# Patient Record
Sex: Male | Born: 1960 | Race: White | Hispanic: No | Marital: Single | Smoking: Never smoker
Health system: Southern US, Community
[De-identification: ages and names within clinical notes are randomized; demographics above are authoritative.]

## PROBLEM LIST (undated history)

## (undated) DIAGNOSIS — R310 Gross hematuria: Secondary | ICD-10-CM

## (undated) HISTORY — DX: Gross hematuria: R31.0

## (undated) HISTORY — PX: HIP FRACTURE SURGERY: SHX118

## (undated) HISTORY — PX: TONSILLECTOMY: SUR1361

---

## 2006-08-05 HISTORY — PX: NASAL SINUS SURGERY: SHX719

## 2009-05-25 ENCOUNTER — Ambulatory Visit: Payer: Self-pay | Admitting: Otolaryngology

## 2009-09-11 ENCOUNTER — Ambulatory Visit: Payer: Self-pay | Admitting: Neurology

## 2011-11-08 ENCOUNTER — Ambulatory Visit: Payer: Self-pay

## 2011-11-29 ENCOUNTER — Ambulatory Visit: Payer: Self-pay | Admitting: Internal Medicine

## 2011-11-29 LAB — COMPREHENSIVE METABOLIC PANEL
Albumin: 4.1 g/dL (ref 3.4–5.0)
Anion Gap: 5 — ABNORMAL LOW (ref 7–16)
BUN: 11 mg/dL (ref 7–18)
Calcium, Total: 9.3 mg/dL (ref 8.5–10.1)
Co2: 30 mmol/L (ref 21–32)
Creatinine: 1 mg/dL (ref 0.60–1.30)
EGFR (African American): >60
Glucose: 114 mg/dL — ABNORMAL HIGH (ref 65–99)
Osmolality: 278 (ref 275–301)
SGPT (ALT): 25 U/L
Sodium: 139 mmol/L (ref 136–145)
Total Protein: 6.8 g/dL (ref 6.4–8.2)

## 2011-11-29 LAB — CBC WITH DIFFERENTIAL/PLATELET
Basophil #: 0 10*3/uL (ref 0.0–0.1)
Basophil %: 0.3 %
Eosinophil #: 0.1 10*3/uL (ref 0.0–0.7)
Lymphocyte #: 0.8 10*3/uL — ABNORMAL LOW (ref 1.0–3.6)
Lymphocyte %: 5.9 %
MCH: 30.9 pg (ref 26.0–34.0)
Monocyte %: 4.8 %
Neutrophil %: 88.6 %
Platelet: 236 10*3/uL (ref 150–440)
WBC: 12.8 10*3/uL — ABNORMAL HIGH (ref 3.8–10.6)

## 2012-05-30 ENCOUNTER — Emergency Department: Payer: Self-pay | Admitting: Emergency Medicine

## 2012-05-30 LAB — COMPREHENSIVE METABOLIC PANEL
Anion Gap: 12 (ref 7–16)
Bilirubin,Total: 0.6 mg/dL (ref 0.2–1.0)
Chloride: 109 mmol/L — ABNORMAL HIGH (ref 98–107)
Co2: 23 mmol/L (ref 21–32)
Creatinine: 1.14 mg/dL (ref 0.60–1.30)
EGFR (African American): >60
EGFR (Non-African Amer.): >60
Osmolality: 286 (ref 275–301)
Potassium: 2.8 mmol/L — ABNORMAL LOW (ref 3.5–5.1)
Sodium: 144 mmol/L (ref 136–145)

## 2012-05-30 LAB — CBC
HCT: 43.1 % (ref 40.0–52.0)
HGB: 14.8 g/dL (ref 13.0–18.0)
MCHC: 34.3 g/dL (ref 32.0–36.0)
Platelet: 285 10*3/uL (ref 150–440)
WBC: 10.2 10*3/uL (ref 3.8–10.6)

## 2012-05-30 LAB — TROPONIN I: Troponin-I: <0.02 ng/mL

## 2013-05-25 ENCOUNTER — Ambulatory Visit: Payer: Self-pay | Admitting: Family Medicine

## 2013-05-25 LAB — COMPREHENSIVE METABOLIC PANEL
Albumin: 4.3 g/dL (ref 3.4–5.0)
Alkaline Phosphatase: 102 U/L (ref 50–136)
BUN: 9 mg/dL (ref 7–18)
Bilirubin,Total: 0.9 mg/dL (ref 0.2–1.0)
Calcium, Total: 9 mg/dL (ref 8.5–10.1)
Chloride: 97 mmol/L — ABNORMAL LOW (ref 98–107)
EGFR (African American): >60
Glucose: 106 mg/dL — ABNORMAL HIGH (ref 65–99)
Osmolality: 267 (ref 275–301)
SGOT(AST): 23 U/L (ref 15–37)
Total Protein: 7.8 g/dL (ref 6.4–8.2)

## 2013-05-25 LAB — CBC WITH DIFFERENTIAL/PLATELET
Basophil #: 0 10*3/uL (ref 0.0–0.1)
Basophil %: 0.4 %
Eosinophil #: 0 10*3/uL (ref 0.0–0.7)
Eosinophil %: 0.1 %
HCT: 48.3 % (ref 40.0–52.0)
HGB: 16 g/dL (ref 13.0–18.0)
Lymphocyte #: 0.5 10*3/uL — ABNORMAL LOW (ref 1.0–3.6)
MCHC: 33.1 g/dL (ref 32.0–36.0)
Monocyte #: 0.4 x10 3/mm (ref 0.2–1.0)
Monocyte %: 8.1 %
Neutrophil #: 4.4 10*3/uL (ref 1.4–6.5)
Neutrophil %: 81.9 %
Platelet: 225 10*3/uL (ref 150–440)

## 2013-05-25 LAB — URINALYSIS, COMPLETE
Bacteria: NEGATIVE
Ph: 6.5 (ref 4.5–8.0)
Protein: 30
RBC,UR: >30 /HPF (ref 0–5)
Specific Gravity: 1.02 (ref 1.003–1.030)
Squamous Epithelial: NONE SEEN

## 2014-04-15 DIAGNOSIS — Z0189 Encounter for other specified special examinations: Secondary | ICD-10-CM | POA: Insufficient documentation

## 2016-02-09 ENCOUNTER — Other Ambulatory Visit: Payer: Self-pay | Admitting: Family Medicine

## 2016-02-09 DIAGNOSIS — R109 Unspecified abdominal pain: Secondary | ICD-10-CM

## 2016-02-14 ENCOUNTER — Ambulatory Visit
Admission: RE | Admit: 2016-02-14 | Discharge: 2016-02-14 | Disposition: A | Discharge status: Home / Self Care | Payer: 59 | Source: Ambulatory Visit | Attending: Family Medicine | Admitting: Family Medicine

## 2016-02-14 DIAGNOSIS — R109 Unspecified abdominal pain: Secondary | ICD-10-CM | POA: Insufficient documentation

## 2016-02-14 DIAGNOSIS — N400 Enlarged prostate without lower urinary tract symptoms: Secondary | ICD-10-CM | POA: Insufficient documentation

## 2016-07-09 ENCOUNTER — Ambulatory Visit
Admission: RE | Admit: 2016-07-09 | Discharge: 2016-07-09 | Disposition: A | Discharge status: Home / Self Care | Payer: 59 | Source: Ambulatory Visit | Attending: Family Medicine | Admitting: Family Medicine

## 2016-07-09 ENCOUNTER — Other Ambulatory Visit: Payer: Self-pay | Admitting: Family Medicine

## 2016-07-09 DIAGNOSIS — R319 Hematuria, unspecified: Secondary | ICD-10-CM | POA: Insufficient documentation

## 2016-07-09 DIAGNOSIS — R109 Unspecified abdominal pain: Secondary | ICD-10-CM | POA: Diagnosis present

## 2016-07-09 DIAGNOSIS — R310 Gross hematuria: Secondary | ICD-10-CM

## 2016-07-10 ENCOUNTER — Other Ambulatory Visit: Payer: Self-pay | Admitting: Family Medicine

## 2016-07-12 ENCOUNTER — Ambulatory Visit: Admission: RE | Admit: 2016-07-12 | Payer: 59 | Source: Ambulatory Visit

## 2016-07-18 ENCOUNTER — Other Ambulatory Visit: Payer: Self-pay | Admitting: *Deleted

## 2016-07-18 DIAGNOSIS — R310 Gross hematuria: Secondary | ICD-10-CM

## 2016-07-19 ENCOUNTER — Other Ambulatory Visit
Admission: RE | Admit: 2016-07-19 | Discharge: 2016-07-19 | Disposition: A | Discharge status: Home / Self Care | Payer: 59 | Source: Ambulatory Visit | Attending: Urology | Admitting: Urology

## 2016-07-19 ENCOUNTER — Ambulatory Visit (INDEPENDENT_AMBULATORY_CARE_PROVIDER_SITE_OTHER): Payer: 59 | Admitting: Urology

## 2016-07-19 ENCOUNTER — Encounter: Payer: Self-pay | Admitting: Urology

## 2016-07-19 VITALS — BP 132/73 | HR 76 | Ht 71.0 in | Wt 147.0 lb

## 2016-07-19 DIAGNOSIS — R310 Gross hematuria: Secondary | ICD-10-CM | POA: Insufficient documentation

## 2016-07-19 LAB — CREATININE, SERUM
Creatinine, Ser: 0.9 mg/dL (ref 0.61–1.24)
GFR calc Af Amer: >60 mL/min (ref >60)
GFR calc non Af Amer: >60 mL/min (ref >60)

## 2016-07-19 LAB — URINALYSIS, COMPLETE (UACMP) WITH MICROSCOPIC
BILIRUBIN URINE: NEGATIVE
Bacteria, UA: NONE SEEN
Glucose, UA: NEGATIVE mg/dL
KETONES UR: NEGATIVE mg/dL
LEUKOCYTES UA: NEGATIVE
NITRITE: NEGATIVE
Protein, ur: NEGATIVE mg/dL
SPECIFIC GRAVITY, URINE: 1.02 (ref 1.005–1.030)
SQUAMOUS EPITHELIAL / LPF: NONE SEEN
WBC, UA: NONE SEEN WBC/hpf (ref 0–5)
pH: 5.5 (ref 5.0–8.0)

## 2016-07-19 LAB — BUN: BUN: 10 mg/dL (ref 6–20)

## 2016-07-19 NOTE — Progress Notes (Signed)
07/19/2016 12:09 PM   Zachary RuizJohn Mikel CellaM Doyle 08-08-1960 [id number]  [facility] [place] [address] [place] [place], [place] [id number]  Chief Complaint  Patient presents with  . New Patient (Initial Visit)    gross hematuria referred by Rolin BarryMario Doyle    HPI: Patient is a 55 -year-old Caucasian male who presents today as a referral from their PCP, Doyle, for gross hematuria.    Patient stated that about 1 week and half ago he had an episode of 2 days of gross hematuria with the morning void that was not painful.  Urine culture performed on 07/08/2016 noted no growth.    Patient doesn't have a prior history of microscopic hematuria.    He does not have a prior history of recurrent urinary tract infections, nephrolithiasis, trauma to the genitourinary tract, BPH or malignancies of the genitourinary tract.  He does not have a family medical history of nephrolithiasis, malignancies of the genitourinary tract or hematuria.   Today, he is having symptoms of urgency, nocturia, incontinence and a weak urinary stream.  His UA today demonstrates 0-5 RBC's.  He is not experiencing any suprapubic pain, abdominal pain or flank pain. He denies any recent fevers, chills, nausea or vomiting.   Renal ultrasound performed on 02/14/2016 for right flank pain noted no evidence of urinary tract stones or hydronephrosis. Normal renal parenchyma. Mild prostate enlargement. Normal appearance of the urinary bladder.  Dependent reviewed the films.  CT renal stone study performed on 07/09/2016 noted anal ureteral stones. No hydronephrosis. No acute findings in the abdomen or pelvis. I have independently reviewed these films.  He is not a smoker.   He is exposed to secondhand smoke.  He has not worked with [employer].    PMH: Past Medical History:  Diagnosis Date  . Gross hematuria     Surgical History: Past Surgical History:  Procedure Laterality Date  . HIP FRACTURE SURGERY Right    . NASAL SINUS SURGERY  2008  . TONSILLECTOMY      Home Medications:  Allergies as of 07/19/2016   No Known Allergies     Medication List       Accurate as of 07/19/16 12:09 PM. Always use your most recent med list.          fluticasone 50 MCG/ACT nasal spray Commonly known as:  FLONASE Place into the nose.   guaiFENesin-codeine 100-10 MG/5ML syrup Commonly known as:  ROBITUSSIN AC Take by mouth.   ibuprofen 200 MG tablet Commonly known as:  ADVIL,MOTRIN Take by mouth.       Allergies: No Known Allergies  Family History: Family History  Problem Relation Age of Onset  . Cancer Sister     appendix  . Prostate cancer Neg Hx   . Kidney cancer Neg Hx   . Bladder Cancer Neg Hx     Social History:  reports that he has never smoked. He has never used smokeless tobacco. He reports that he drinks alcohol. He reports that he does not use drugs.  ROS: UROLOGY Frequent Urination?: No Hard to postpone urination?: Yes Burning/pain with urination?: No Get up at night to urinate?: Yes Leakage of urine?: No Urine stream starts and stops?: No Trouble starting stream?: No Do you have to strain to urinate?: No Blood in urine?: Yes Urinary tract infection?: No Sexually transmitted disease?: No Injury to kidneys or bladder?: No Painful intercourse?: No Weak stream?: Yes Erection problems?: No Penile pain?: No  Gastrointestinal Nausea?:  No Vomiting?: No Indigestion/heartburn?: No Diarrhea?: No Constipation?: No  Constitutional Fever: No Night sweats?: No Weight loss?: No Fatigue?: No  Skin Skin rash/lesions?: No Itching?: No  Eyes Blurred vision?: No Double vision?: No  Ears/Nose/Throat Sore throat?: No Sinus problems?: Yes  Hematologic/Lymphatic Swollen glands?: No Easy bruising?: No  Cardiovascular Leg swelling?: No Chest pain?: No  Respiratory Cough?: No Shortness of breath?: No  Endocrine Excessive thirst?:  No  Musculoskeletal Back pain?: No Joint pain?: No  Neurological Headaches?: No Dizziness?: No  Psychologic Depression?: No Anxiety?: No  Physical Exam: BP 132/73   Pulse 76   Ht 5\' 11"  (1.803 Zachary)   Wt 147 lb (66.7 kg)   BMI 20.50 kg/Zachary   Constitutional: Well nourished. Alert and oriented, No acute distress. HEENT: [place] AT, moist mucus membranes. Trachea midline, no masses. Cardiovascular: No clubbing, cyanosis, or edema. Respiratory: Normal respiratory effort, no increased work of breathing. GI: Abdomen is soft, non tender, non distended, no abdominal masses. Liver and spleen not palpable.  No hernias appreciated.  Stool sample for occult testing is not indicated.   GU: No CVA tenderness.  No bladder fullness or masses.  Patient with circumcised phallus.  Urethral meatus is patent.  No penile discharge. No penile lesions or rashes. Scrotum without lesions, cysts, rashes and/or edema.  Testicles are located scrotally bilaterally. No masses are appreciated in the testicles. Left and right epididymis are normal. Rectal: Patient with  normal sphincter tone. Anus and perineum without scarring or rashes. No rectal masses are appreciated. Prostate is approximately 50 grams, no nodules are appreciated. Seminal vesicles are normal. Skin: No rashes, bruises or suspicious lesions. Lymph: No cervical or inguinal adenopathy. Neurologic: Grossly intact, no focal deficits, moving all 4 extremities. Psychiatric: Normal mood and affect.  Laboratory Data: PSA History  0.9 ng/mL on 11/15/2013  1.01 ng/mL on 01/31/2016 Lab Results  Component Value Date   WBC 5.4 05/25/2013   HGB 16.0 05/25/2013   HCT 48.3 05/25/2013   MCV 92 05/25/2013   PLT 225 05/25/2013    Lab Results  Component Value Date   CREATININE 0.90 07/19/2016    Lab Results  Component Value Date   AST 23 05/25/2013   Lab Results  Component Value Date   ALT 34 05/25/2013     Urinalysis 0-5 RBC's.  See EPIC.     Pertinent Imaging: CLINICAL DATA:  Six-month history of right flank pain  EXAM: RENAL / URINARY TRACT ULTRASOUND COMPLETE  COMPARISON:  Noncontrast abdominal CT scan of May 28, 2013  FINDINGS: Right Kidney:  Length: 10.4 cm. Echogenicity within normal limits. No mass, stones, or hydronephrosis visualized.  Left Kidney:  Length: 11.2 cm. Echogenicity within normal limits. No mass, stones, or hydronephrosis visualized.  Bladder:  The partially distended urinary bladder is normal. Bilateral ureteral jets are observed. The prostate gland is mildly prominent measuring 5.0 x 3.0 x 2.7 cm.  IMPRESSION: 1. No evidence of urinary tract stones or hydronephrosis. Normal renal parenchyma. 2. Mild prostate enlargement. Normal appearance of the urinary bladder.   Electronically Signed   By: David  [place]il[place] Zachary.D.   On: 02/14/2016 10:31  CLINICAL DATA:  Right flank pain for 3 days, hematuria  EXAM: CT ABDOMEN AND PELVIS WITHOUT CONTRAST  TECHNIQUE: Multidetector CT imaging of the abdomen and pelvis was performed following the standard protocol without IV contrast.  COMPARISON:  05/25/2013  FINDINGS: Lower chest: Lung bases are clear. No effusions. Heart is normal size.  Hepatobiliary: No focal hepatic abnormality.  Gallbladder unremarkable.  Pancreas: No focal abnormality or ductal dilatation.  Spleen: No focal abnormality.  Normal size.  Adrenals/Urinary Tract: No adrenal abnormality. No focal renal abnormality. No stones or hydronephrosis. Urinary bladder is unremarkable.  Stomach/Bowel: Stomach, large and small bowel grossly unremarkable.  Vascular/Lymphatic: No evidence of aneurysm or adenopathy. Scattered aortic calcifications.  Reproductive: Mild prostate enlargement with central calcifications.  Other: No free fluid or free air.  Musculoskeletal: No acute bony abnormality or focal bone lesion.  IMPRESSION: No renal or  ureteral stones.  No hydronephrosis.  No acute findings in the abdomen or pelvis.   Electronically Signed   By: Charlett Nose Zachary.D.   On: 07/09/2016 11:07  Assessment & Plan:    1. Gross hematuria   - I explained to the patient that there are a number of causes that can be associated with blood in the urine, such as stones, UTI's, damage to the urinary tract and/or cancer.  - At this time, I felt that the patient warranted further urologic evaluation.   The AUA guidelines state that a CT urogram is the preferred imaging study to evaluate hematuria- I explained that the noncontrast study he had earlier this month lacked detail to rule out urological tumors  - I explained to the patient that a contrast material will be injected into a vein and that in rare instances, an allergic reaction can result and may even life threatening   The patient denies any allergies to contrast, iodine and/or seafood and is not taking metformin.  - Following the imaging study,  I've recommended a cystoscopy. I described how this is performed, typically in an office setting with a flexible cystoscope. We described the risks, benefits, and possible side effects, the most common of which is a minor amount of blood in the urine and/or burning which usually resolves in 24 to 48 hours.    - The patient had the opportunity to ask questions which were answered. Based upon this discussion, the patient is willing to proceed. Therefore, I've ordered: a CT Urogram and cystoscopy.  - The patient will return following all of the above for discussion of the results.     - BUN + creatinine  - urinalysis  - urine culture  Return for CT Urogram report and cystoscopy.  These notes generated with voice recognition software. I apologize for typographical errors.  Michiel Cowboy, PA-C  [facility] [address], [address] [place], [place] [address] [phone]

## 2016-07-20 LAB — URINE CULTURE: Culture: NO GROWTH

## 2016-08-20 ENCOUNTER — Ambulatory Visit
Admission: RE | Admit: 2016-08-20 | Discharge: 2016-08-20 | Disposition: A | Discharge status: Home / Self Care | Payer: 59 | Source: Ambulatory Visit | Attending: Urology | Admitting: Urology

## 2016-08-20 DIAGNOSIS — R310 Gross hematuria: Secondary | ICD-10-CM | POA: Insufficient documentation

## 2016-08-20 DIAGNOSIS — I700 Atherosclerosis of aorta: Secondary | ICD-10-CM | POA: Insufficient documentation

## 2016-08-20 MED ORDER — IOPAMIDOL (ISOVUE-300) INJECTION 61%
150.0000 mL | Freq: Once | INTRAVENOUS | Status: AC | PRN
  Administered 2016-08-20: 125 mL via INTRAVENOUS

## 2016-08-23 ENCOUNTER — Ambulatory Visit (INDEPENDENT_AMBULATORY_CARE_PROVIDER_SITE_OTHER): Payer: 59 | Admitting: Urology

## 2016-08-23 ENCOUNTER — Other Ambulatory Visit
Admission: RE | Admit: 2016-08-23 | Discharge: 2016-08-23 | Disposition: A | Discharge status: Home / Self Care | Payer: 59 | Source: Ambulatory Visit | Attending: Urology | Admitting: Urology

## 2016-08-23 ENCOUNTER — Encounter: Payer: Self-pay | Admitting: Urology

## 2016-08-23 ENCOUNTER — Other Ambulatory Visit: Payer: Self-pay

## 2016-08-23 VITALS — BP 142/74 | HR 100 | Ht 71.0 in | Wt 150.0 lb

## 2016-08-23 DIAGNOSIS — R310 Gross hematuria: Secondary | ICD-10-CM | POA: Insufficient documentation

## 2016-08-23 DIAGNOSIS — N400 Enlarged prostate without lower urinary tract symptoms: Secondary | ICD-10-CM

## 2016-08-23 LAB — URINALYSIS, COMPLETE (UACMP) WITH MICROSCOPIC
BILIRUBIN URINE: NEGATIVE
Bacteria, UA: NONE SEEN
GLUCOSE, UA: NEGATIVE mg/dL
KETONES UR: NEGATIVE mg/dL
Leukocytes, UA: NEGATIVE
NITRITE: NEGATIVE
PH: 6 (ref 5.0–8.0)
PROTEIN: NEGATIVE mg/dL
SQUAMOUS EPITHELIAL / LPF: NONE SEEN
Specific Gravity, Urine: 1.025 (ref 1.005–1.030)

## 2016-08-23 MED ORDER — LIDOCAINE HCL 2 % EX GEL
1.0000 "application " | Freq: Once | CUTANEOUS | Status: AC
  Administered 2016-08-23: 1 via URETHRAL

## 2016-08-23 MED ORDER — CIPROFLOXACIN HCL 500 MG PO TABS
500.0000 mg | ORAL_TABLET | Freq: Once | ORAL | Status: AC
  Administered 2016-08-23: 500 mg via ORAL

## 2016-08-23 NOTE — Progress Notes (Signed)
   08/24/16  CC:  Chief Complaint  Patient presents with  . Cysto    HPI: 56 year old male with gross hematuria who presents today for office cystoscopy to complete his workup.  CT urogram on 08/20/2016 shows no evidence of GU pathology.   He does not have a prior history of recurrent urinary tract infections, nephrolithiasis, trauma to the genitourinary tract, BPH or malignancies of the genitourinary tract.  He does not have a family medical history of nephrolithiasis, malignancies of the genitourinary tract or hematuria.   He is a nonsmoker. He does have exposure to secondhand smoking. No chemical exposures.  Exam Vitals:   08/23/16 1450  Weight: 150 lb (68 kg)  Height: 5\' 11"  (1.803 m)   NED. A&Ox3.   No respiratory distress   Abd soft, NT, ND Normal phallus with bilateral descended testicles  Cystoscopy Procedure Note  Patient identification was confirmed, informed consent was obtained, and patient was prepped using Betadine solution.  Lidocaine jelly was administered per urethral meatus.    Preoperative abx where received prior to procedure.     Pre-Procedure: - Inspection reveals a normal caliber ureteral meatus.  Procedure: The flexible cystoscope was introduced without difficulty - No urethral strictures/lesions are present. - Mildly enlarged prostate, friable mucosa - Normal bladder neck - Bilateral ureteral orifices identified - Bladder mucosa  reveals no ulcers, tumors, or lesions - No bladder stones - No trabeculation  Retroflexion shows subtle intravesical protrusion with few ectatic vessels.   Post-Procedure: - Patient tolerated the procedure well  Assessment/ Plan:  1. Gross hematuria Cystoscopy to friable prostatic mucosa- likely cause of hematuria CT Urogram unremarkable Discussed finasteride for bleeding  Episodes are rare and mild,  otherwise no obstructing voiding symptoms As such, will defer at this time, advised to return if  bleeding recurs   - ciprofloxacin (CIPRO) tablet 500 mg; Take 1 tablet (500 mg total) by mouth once. - lidocaine (XYLOCAINE) 2 % jelly 1 application; Place 1 application into the urethra once.  2. Benign prostatic hyperplasia without lower urinary tract symptoms Mildly enlarged prostate on cysto Relatively asymptomatic  Follow up prn  Vanna ScotlandAshley Bette Brienza, MD

## 2018-02-06 ENCOUNTER — Encounter: Payer: Self-pay | Admitting: Emergency Medicine

## 2018-02-06 ENCOUNTER — Observation Stay
Admission: EM | Admit: 2018-02-06 | Discharge: 2018-02-07 | Disposition: A | Discharge status: Home / Self Care | Payer: 59 | Attending: Family Medicine | Admitting: Family Medicine

## 2018-02-06 ENCOUNTER — Other Ambulatory Visit: Payer: Self-pay

## 2018-02-06 DIAGNOSIS — R112 Nausea with vomiting, unspecified: Secondary | ICD-10-CM

## 2018-02-06 DIAGNOSIS — E876 Hypokalemia: Secondary | ICD-10-CM | POA: Diagnosis not present

## 2018-02-06 DIAGNOSIS — F1210 Cannabis abuse, uncomplicated: Secondary | ICD-10-CM | POA: Insufficient documentation

## 2018-02-06 DIAGNOSIS — K529 Noninfective gastroenteritis and colitis, unspecified: Secondary | ICD-10-CM | POA: Diagnosis not present

## 2018-02-06 DIAGNOSIS — N400 Enlarged prostate without lower urinary tract symptoms: Secondary | ICD-10-CM | POA: Insufficient documentation

## 2018-02-06 LAB — CBC
HCT: 45.8 % (ref 40.0–52.0)
Hemoglobin: 15.7 g/dL (ref 13.0–18.0)
MCH: 31.2 pg (ref 26.0–34.0)
MCHC: 34.2 g/dL (ref 32.0–36.0)
MCV: 91.1 fL (ref 80.0–100.0)
PLATELETS: 265 10*3/uL (ref 150–440)
RBC: 5.03 MIL/uL (ref 4.40–5.90)
RDW: 13.6 % (ref 11.5–14.5)
WBC: 10.7 10*3/uL — ABNORMAL HIGH (ref 3.8–10.6)

## 2018-02-06 LAB — COMPREHENSIVE METABOLIC PANEL
ALBUMIN: 4.1 g/dL (ref 3.5–5.0)
ALT: 15 U/L (ref 0–44)
AST: 25 U/L (ref 15–41)
Alkaline Phosphatase: 70 U/L (ref 38–126)
Anion gap: 11 (ref 5–15)
BILIRUBIN TOTAL: 1.5 mg/dL — AB (ref 0.3–1.2)
BUN: 9 mg/dL (ref 6–20)
CHLORIDE: 108 mmol/L (ref 98–111)
CO2: 21 mmol/L — ABNORMAL LOW (ref 22–32)
CREATININE: 0.83 mg/dL (ref 0.61–1.24)
Calcium: 8.4 mg/dL — ABNORMAL LOW (ref 8.9–10.3)
GFR calc Af Amer: >60 mL/min (ref >60)
GLUCOSE: 104 mg/dL — AB (ref 70–99)
Potassium: 3.1 mmol/L — ABNORMAL LOW (ref 3.5–5.1)
Sodium: 140 mmol/L (ref 135–145)
Total Protein: 6.5 g/dL (ref 6.5–8.1)

## 2018-02-06 LAB — URINE DRUG SCREEN, QUALITATIVE (ARMC ONLY)
AMPHETAMINES, UR SCREEN: NOT DETECTED
Benzodiazepine, Ur Scrn: NOT DETECTED
CANNABINOID 50 NG, UR ~~LOC~~: POSITIVE — AB
Cocaine Metabolite,Ur ~~LOC~~: NOT DETECTED
MDMA (ECSTASY) UR SCREEN: NOT DETECTED
Methadone Scn, Ur: NOT DETECTED
OPIATE, UR SCREEN: NOT DETECTED
Phencyclidine (PCP) Ur S: NOT DETECTED
Tricyclic, Ur Screen: NOT DETECTED

## 2018-02-06 LAB — URINALYSIS, COMPLETE (UACMP) WITH MICROSCOPIC
Bacteria, UA: NONE SEEN
Bilirubin Urine: NEGATIVE
GLUCOSE, UA: NEGATIVE mg/dL
Ketones, ur: 80 mg/dL — AB
Leukocytes, UA: NEGATIVE
Nitrite: NEGATIVE
PH: 6 (ref 5.0–8.0)
PROTEIN: NEGATIVE mg/dL
SQUAMOUS EPITHELIAL / LPF: NONE SEEN (ref 0–5)
Specific Gravity, Urine: 1.02 (ref 1.005–1.030)

## 2018-02-06 LAB — LIPASE, BLOOD: LIPASE: 28 U/L (ref 11–51)

## 2018-02-06 MED ORDER — ONDANSETRON HCL 4 MG/2ML IJ SOLN
4.0000 mg | Freq: Once | INTRAMUSCULAR | Status: AC | PRN
  Administered 2018-02-06: 4 mg via INTRAVENOUS
  Filled 2018-02-06: qty 2

## 2018-02-06 MED ORDER — FAMOTIDINE IN NACL 20-0.9 MG/50ML-% IV SOLN
20.0000 mg | Freq: Once | INTRAVENOUS | Status: AC
  Administered 2018-02-06: 20 mg via INTRAVENOUS
  Filled 2018-02-06: qty 50

## 2018-02-06 MED ORDER — METOCLOPRAMIDE HCL 5 MG/ML IJ SOLN
10.0000 mg | Freq: Once | INTRAMUSCULAR | Status: AC
  Administered 2018-02-06: 10 mg via INTRAVENOUS
  Filled 2018-02-06: qty 2

## 2018-02-06 MED ORDER — PROMETHAZINE HCL 25 MG/ML IJ SOLN
25.0000 mg | Freq: Once | INTRAMUSCULAR | Status: AC
  Administered 2018-02-06 – 2018-02-07 (×2): 25 mg via INTRAVENOUS

## 2018-02-06 MED ORDER — SODIUM CHLORIDE 0.9 % IV SOLN
Freq: Once | INTRAVENOUS | Status: AC
  Administered 2018-02-06: 21:00:00 via INTRAVENOUS

## 2018-02-06 MED ORDER — SODIUM CHLORIDE 0.9 % IV BOLUS
1000.0000 mL | Freq: Once | INTRAVENOUS | Status: AC
  Administered 2018-02-06: 1000 mL via INTRAVENOUS

## 2018-02-06 MED ORDER — PROMETHAZINE HCL 25 MG/ML IJ SOLN
INTRAMUSCULAR | Status: AC
  Administered 2018-02-07: 25 mg via INTRAVENOUS
  Filled 2018-02-06: qty 1

## 2018-02-06 NOTE — ED Notes (Signed)
VErbal orders given for fluid bolus at this time

## 2018-02-06 NOTE — ED Notes (Signed)
Urinal provided for pt.

## 2018-02-06 NOTE — ED Provider Notes (Signed)
[facility] Emergency Department Provider Note       Time seen: ----------------------------------------- 8:39 PM on 02/06/2018 -----------------------------------------   I have reviewed the triage vital signs and the nursing notes.  HISTORY   Chief Complaint Emesis and Diarrhea    HPI Zachary Doyle is a 57 y.o. male with no significant past medical history who presents to the ED for intractable nausea and vomiting with some diarrhea over the last 24 hours.  Patient states he started throwing up about this time yesterday, he is complaining of abdominal cramping but no focal pain.  He denies fevers, chills or other complaints.  Prior to my evaluation he received 2 doses of IV Zofran and liter fluids without any improvement in his symptoms.  Past Medical History:  Diagnosis Date  . Gross hematuria     Patient Active Problem List   Diagnosis Date Noted  . Laboratory procedures 04/15/2014    Past Surgical History:  Procedure Laterality Date  . HIP FRACTURE SURGERY Right   . NASAL SINUS SURGERY  2008  . TONSILLECTOMY      Allergies Patient has no known allergies.  Social History Social History   Tobacco Use  . Smoking status: Never Smoker  . Smokeless tobacco: Never Used  Substance Use Topics  . Alcohol use: Yes    Comment: rare  . Drug use: No   Review of Systems Constitutional: Negative for fever. Cardiovascular: Negative for chest pain. Respiratory: Negative for shortness of breath. Gastrointestinal: Positive for abdominal pain, vomiting and diarrhea Musculoskeletal: Negative for back pain. Skin: Negative for rash. Neurological: Negative for headaches, focal weakness or numbness.  All systems negative/normal/unremarkable except as stated in the HPI  ____________________________________________   PHYSICAL EXAM:  VITAL SIGNS: ED Triage Vitals  Enc Vitals Group     BP 02/06/18 1648 137/78     Pulse Rate 02/06/18 1917 68      Resp 02/06/18 1648 20     Temp 02/06/18 1648 (!) 97.5 F (36.4 C)     Temp Source 02/06/18 1648 Oral     SpO2 02/06/18 1648 99 %     Weight 02/06/18 1648 145 lb (65.8 kg)     Height 02/06/18 1648 5\' 11"  (1.803 m)     Head Circumference --      Peak Flow --      Pain Score 02/06/18 1648 5     Pain Loc --      Pain Edu? --      Excl. in GC? --    Constitutional: Alert and oriented.  Actively vomiting, mild distress Eyes: Conjunctivae are normal. Normal extraocular movements. ENT   Head: Normocephalic and atraumatic.   Nose: No congestion/rhinnorhea.   Mouth/Throat: Mucous membranes are moist.   Neck: No stridor. Cardiovascular: Normal rate, regular rhythm. No murmurs, rubs, or gallops. Respiratory: Normal respiratory effort without tachypnea nor retractions. Breath sounds are clear and equal bilaterally. No wheezes/rales/rhonchi. Gastrointestinal: Soft and nontender. Normal bowel sounds Musculoskeletal: Nontender with normal range of motion in extremities. No lower extremity tenderness nor edema. Neurologic:  Normal speech and language. No gross focal neurologic deficits are appreciated.  Skin:  Skin is warm, dry and intact. No rash noted. Psychiatric: Mood and affect are normal. Speech and behavior are normal.   ____________________________________________  ED COURSE:  As part of my medical decision making, I reviewed the following data within the electronic MEDICAL RECORD NUMBER History obtained from family if available, nursing notes, old chart and ekg,  as well as notes from prior ED visits. Patient presented for persistent vomiting and diarrhea, we will assess with labs and imaging as indicated at this time.   Procedures ____________________________________________   LABS (pertinent positives/negatives)  Labs Reviewed  CBC - Abnormal; Notable for the following components:      Result Value   WBC 10.7 (*)    All other components within normal limits   COMPREHENSIVE METABOLIC PANEL - Abnormal; Notable for the following components:   Potassium 3.1 (*)    CO2 21 (*)    Glucose, Bld 104 (*)    Calcium 8.4 (*)    Total Bilirubin 1.5 (*)    All other components within normal limits  GASTROINTESTINAL PANEL BY PCR, STOOL (REPLACES STOOL CULTURE)  C DIFFICILE QUICK SCREEN W PCR REFLEX  LIPASE, BLOOD  URINALYSIS, COMPLETE (UACMP) WITH MICROSCOPIC  URINE DRUG SCREEN, QUALITATIVE ([facility] ONLY)  ____________________________________________  DIFFERENTIAL DIAGNOSIS   Dehydration, electrolyte abnormality, gastroenteritis, gastritis, withdrawal  FINAL ASSESSMENT AND PLAN  Intractable vomiting   Plan: The patient had presented for vomiting and diarrhea. Patient's labs are grossly unremarkable and yet he cannot stop vomiting.  Patient is received at least 5 doses of antibiotics without significant improvement.  I have advised that if he can give [place] a sample of his diarrhea we could tell him the exact etiology.  He does not feel well enough to go home, I will discuss with the hospitalist for admission.   Ulice Dash, MD   Note: This note was generated in part or whole with voice recognition software. Voice recognition is usually quite accurate but there are transcription errors that can and very often do occur. I apologize for any typographical errors that were not detected and corrected.     Emily Filbert, MD 02/06/18 2147

## 2018-02-06 NOTE — ED Triage Notes (Signed)
Pt to ED via POV with c/o n/v/d x24hrs. Pt denies fever. C/o abd cramping. VSS

## 2018-02-07 ENCOUNTER — Other Ambulatory Visit: Payer: Self-pay

## 2018-02-07 ENCOUNTER — Observation Stay: Payer: 59

## 2018-02-07 DIAGNOSIS — K529 Noninfective gastroenteritis and colitis, unspecified: Secondary | ICD-10-CM | POA: Diagnosis present

## 2018-02-07 LAB — BASIC METABOLIC PANEL
ANION GAP: 7 (ref 5–15)
BUN: 7 mg/dL (ref 6–20)
CO2: 23 mmol/L (ref 22–32)
Calcium: 8.1 mg/dL — ABNORMAL LOW (ref 8.9–10.3)
Chloride: 112 mmol/L — ABNORMAL HIGH (ref 98–111)
Creatinine, Ser: 0.77 mg/dL (ref 0.61–1.24)
GFR calc Af Amer: >60 mL/min (ref >60)
GFR calc non Af Amer: >60 mL/min (ref >60)
GLUCOSE: 93 mg/dL (ref 70–99)
POTASSIUM: 3.3 mmol/L — AB (ref 3.5–5.1)
Sodium: 142 mmol/L (ref 135–145)

## 2018-02-07 LAB — CBC
HEMATOCRIT: 38.2 % — AB (ref 40.0–52.0)
Hemoglobin: 13.3 g/dL (ref 13.0–18.0)
MCH: 31.7 pg (ref 26.0–34.0)
MCHC: 34.8 g/dL (ref 32.0–36.0)
MCV: 91.3 fL (ref 80.0–100.0)
Platelets: 235 10*3/uL (ref 150–440)
RBC: 4.19 MIL/uL — AB (ref 4.40–5.90)
RDW: 13.6 % (ref 11.5–14.5)
WBC: 12.9 10*3/uL — AB (ref 3.8–10.6)

## 2018-02-07 LAB — MAGNESIUM: Magnesium: 1.9 mg/dL (ref 1.7–2.4)

## 2018-02-07 LAB — GLUCOSE, CAPILLARY: Glucose-Capillary: 84 mg/dL (ref 70–99)

## 2018-02-07 MED ORDER — FAMOTIDINE IN NACL 20-0.9 MG/50ML-% IV SOLN
20.0000 mg | Freq: Two times a day (BID) | INTRAVENOUS | Status: DC
  Administered 2018-02-07 (×2): 20 mg via INTRAVENOUS
  Filled 2018-02-07 (×2): qty 50

## 2018-02-07 MED ORDER — DOCUSATE SODIUM 100 MG PO CAPS
100.0000 mg | ORAL_CAPSULE | Freq: Two times a day (BID) | ORAL | Status: DC
  Administered 2018-02-07: 100 mg via ORAL
  Filled 2018-02-07: qty 1

## 2018-02-07 MED ORDER — ACETAMINOPHEN 325 MG PO TABS: 650.0000 mg | ORAL_TABLET | Freq: Four times a day (QID) | ORAL | Status: DC | PRN

## 2018-02-07 MED ORDER — ACETAMINOPHEN 650 MG RE SUPP: 650.0000 mg | Freq: Four times a day (QID) | RECTAL | Status: DC | PRN

## 2018-02-07 MED ORDER — POTASSIUM CHLORIDE CRYS ER 20 MEQ PO TBCR
40.0000 meq | EXTENDED_RELEASE_TABLET | Freq: Once | ORAL | Status: AC
  Administered 2018-02-07: 40 meq via ORAL
  Filled 2018-02-07: qty 2

## 2018-02-07 MED ORDER — POTASSIUM CHLORIDE 20 MEQ PO PACK
40.0000 meq | PACK | Freq: Once | ORAL | Status: DC
  Filled 2018-02-07: qty 2

## 2018-02-07 MED ORDER — IOHEXOL 300 MG/ML  SOLN
100.0000 mL | Freq: Once | INTRAMUSCULAR | Status: AC | PRN
  Administered 2018-02-07: 100 mL via INTRAVENOUS

## 2018-02-07 MED ORDER — ONDANSETRON HCL 4 MG PO TABS: 4.0000 mg | ORAL_TABLET | Freq: Three times a day (TID) | ORAL | 1 refills | Status: AC | PRN

## 2018-02-07 MED ORDER — BISACODYL 5 MG PO TBEC: 5.0000 mg | DELAYED_RELEASE_TABLET | Freq: Every day | ORAL | Status: DC | PRN

## 2018-02-07 MED ORDER — SODIUM CHLORIDE 0.9 % IV SOLN
INTRAVENOUS | Status: DC
  Administered 2018-02-07: 02:00:00 via INTRAVENOUS

## 2018-02-07 MED ORDER — HYDROCODONE-ACETAMINOPHEN 5-325 MG PO TABS: 1.0000 | ORAL_TABLET | ORAL | Status: DC | PRN

## 2018-02-07 MED ORDER — POTASSIUM CHLORIDE IN NACL 40-0.9 MEQ/L-% IV SOLN
INTRAVENOUS | Status: DC
  Administered 2018-02-07: 75 mL/h via INTRAVENOUS
  Filled 2018-02-07 (×3): qty 1000

## 2018-02-07 MED ORDER — ONDANSETRON HCL 4 MG PO TABS: 4.0000 mg | ORAL_TABLET | Freq: Four times a day (QID) | ORAL | Status: DC | PRN

## 2018-02-07 MED ORDER — ONDANSETRON HCL 4 MG/2ML IJ SOLN
4.0000 mg | Freq: Four times a day (QID) | INTRAMUSCULAR | Status: DC | PRN
Start: 2018-02-07 — End: 2018-02-07

## 2018-02-07 MED ORDER — FINASTERIDE 5 MG PO TABS
5.0000 mg | ORAL_TABLET | Freq: Every day | ORAL | Status: DC
  Administered 2018-02-07: 5 mg via ORAL
  Filled 2018-02-07: qty 1

## 2018-02-07 NOTE — Progress Notes (Addendum)
Patient did not drink the oral potassium supplement that had been mixed with water, stating that he was unable to drink it due to the taste. Dr. Katheren ShamsSalary was notified of this and gave a verbal order for a one time dose of potassium tablet 40mEq.

## 2018-02-07 NOTE — H&P (Signed)
[facility] - [place] at [facility]   PATIENT NAME: Zachary Doyle    MR#:  [id number]  DATE OF BIRTH:  Jan 26, 1961  DATE OF ADMISSION:  02/06/2018  PRIMARY CARE PHYSICIAN: Dione Housekeeperlmedo, Mario Ernesto, MD   REQUESTING/REFERRING PHYSICIAN:   CHIEF COMPLAINT:   Chief Complaint  Patient presents with  . Emesis  . Diarrhea    HISTORY OF PRESENT ILLNESS: Zachary Doyle  is a 57 y.o. male with a known history of BPH. Patient presented to emergency room for acute onset of intractable nausea, vomiting and diarrhea going on for the past 24 hours.  He also complains of mid abdominal cramps, worse with vomiting.  Patient had subjective fever and chills at home in the past 24 hours.  Patient denies any travel, no sick contacts.  He ate 1 can of soup that was expired, yesterday, but otherwise nothing new in his diet.  He is been treated with IV antiemetics and IV fluids for the past 6 hours in the emergency room without much improvement in his symptoms. Blood test done emergency room are remarkable for low potassium level at 3.1.  Urine drug test is positive for marijuana..  Abdominal CAT scan is unremarkable. Patient is admitted for further evaluation and treatment.  PAST MEDICAL HISTORY:   Past Medical History:  Diagnosis Date  . Gross hematuria     PAST SURGICAL HISTORY:  Past Surgical History:  Procedure Laterality Date  . HIP FRACTURE SURGERY Right   . NASAL SINUS SURGERY  2008  . TONSILLECTOMY      SOCIAL HISTORY:  Social History   Tobacco Use  . Smoking status: Never Smoker  . Smokeless tobacco: Never Used  Substance Use Topics  . Alcohol use: Yes    Comment: rare    FAMILY HISTORY:  Family History  Problem Relation Age of Onset  . Cancer Sister        appendix  . Prostate cancer Neg Hx   . Kidney cancer Neg Hx   . Bladder Cancer Neg Hx     DRUG ALLERGIES: No Known Allergies  REVIEW OF SYSTEMS:   CONSTITUTIONAL: Positive for subjective fever and  chills, fatigue and generalized weakness.  EYES: No blurred or double vision.  EARS, NOSE, AND THROAT: No tinnitus or ear pain.  RESPIRATORY: No cough, shortness of breath, wheezing or hemoptysis.  CARDIOVASCULAR: No chest pain, orthopnea, edema.  GASTROINTESTINAL: Positive for nausea, vomiting, diarrhea and abdominal pain.  GENITOURINARY: No dysuria, hematuria.  ENDOCRINE: No polyuria, nocturia,  HEMATOLOGY: No anemia, easy bruising or bleeding SKIN: No rash or lesion. MUSCULOSKELETAL: No joint pain or arthritis.   NEUROLOGIC: No tingling, numbness, weakness.  PSYCHIATRY: No anxiety or depression.   MEDICATIONS AT HOME:  Prior to Admission medications   Medication Sig Start Date End Date Taking? Authorizing Provider  finasteride (PROSCAR) 5 MG tablet Take 5 mg by mouth daily. 01/29/18  Yes [provider]      PHYSICAL EXAMINATION:   VITAL SIGNS: Blood pressure 116/68, pulse 82, temperature 99.4 F (37.4 C), temperature source Oral, resp. rate 18, height 5\' 11"  (1.803 m), weight 65.8 kg (145 lb), SpO2 98 %.  GENERAL:  57 y.o.-year-old patient lying in the bed with no acute distress.  EYES: Pupils equal, round, reactive to light and accommodation. No scleral icterus. Extraocular muscles intact.  HEENT: Head atraumatic, normocephalic. Oropharynx and nasopharynx clear.  NECK:  Supple, no jugular venous distention. No thyroid enlargement, no tenderness.  LUNGS: Normal breath sounds  bilaterally, no wheezing, rales,rhonchi or crepitation. No use of accessory muscles of respiration.  CARDIOVASCULAR: S1, S2 normal. No S3/S4.  ABDOMEN: Soft, nontender, nondistended. Bowel sounds present. No organomegaly or mass.  EXTREMITIES: No pedal edema, cyanosis, or clubbing.  NEUROLOGIC: Cranial nerves II through XII are intact. Muscle strength 5/5 in all extremities. Sensation intact. Gait not checked.  PSYCHIATRIC: The patient is alert and oriented x 3.  SKIN: No obvious rash, lesion, or  ulcer.   LABORATORY PANEL:   CBC Recent Labs  Lab 02/06/18 1649  WBC 10.7*  HGB 15.7  HCT 45.8  PLT 265  MCV 91.1  MCH 31.2  MCHC 34.2  RDW 13.6   ------------------------------------------------------------------------------------------------------------------  Chemistries  Recent Labs  Lab 02/06/18 1850  NA 140  K 3.1*  CL 108  CO2 21*  GLUCOSE 104*  BUN 9  CREATININE 0.83  CALCIUM 8.4*  AST 25  ALT 15  ALKPHOS 70  BILITOT 1.5*   ------------------------------------------------------------------------------------------------------------------ estimated creatinine clearance is 89.2 mL/min (by C-G formula based on SCr of 0.83 mg/dL). ------------------------------------------------------------------------------------------------------------------ No results for input(s): TSH, T4TOTAL, T3FREE, THYROIDAB in the last 72 hours.  Invalid input(s): FREET3   Coagulation profile No results for input(s): INR, PROTIME in the last 168 hours. ------------------------------------------------------------------------------------------------------------------- No results for input(s): DDIMER in the last 72 hours. -------------------------------------------------------------------------------------------------------------------  Cardiac Enzymes No results for input(s): CKMB, TROPONINI, MYOGLOBIN in the last 168 hours.  Invalid input(s): CK ------------------------------------------------------------------------------------------------------------------ Invalid input(s): POCBNP  ---------------------------------------------------------------------------------------------------------------  Urinalysis    Component Value Date/Time   COLORURINE YELLOW (A) 02/06/2018 2124   APPEARANCEUR CLEAR (A) 02/06/2018 2124   APPEARANCEUR CLEAR 05/25/2013 1815   LABSPEC 1.020 02/06/2018 2124   LABSPEC 1.020 05/25/2013 1815   PHURINE 6.0 02/06/2018 2124   GLUCOSEU NEGATIVE 02/06/2018  2124   GLUCOSEU NEGATIVE 05/25/2013 1815   HGBUR MODERATE (A) 02/06/2018 2124   BILIRUBINUR NEGATIVE 02/06/2018 2124   BILIRUBINUR NEGATIVE 05/25/2013 1815   KETONESUR 80 (A) 02/06/2018 2124   PROTEINUR NEGATIVE 02/06/2018 2124   NITRITE NEGATIVE 02/06/2018 2124   LEUKOCYTESUR NEGATIVE 02/06/2018 2124   LEUKOCYTESUR NEGATIVE 05/25/2013 1815     RADIOLOGY: No results found.  EKG: No orders found for this or any previous visit.  IMPRESSION AND PLAN:  1.  Acute gastroenteritis, could be viral in nature.  Continue supportive measures with IV fluids and antiemetics.  We will keep patient n.p.o. for now and start with clear liquids in the morning.  Stool test pending. 2.  Hypokalemia, secondary to vomiting and diarrhea.  Will replace potassium per protocol. 3.  Marijuana abuse.  Cessation was discussed with patient in detail.    All the records are reviewed and case discussed with ED provider. Management plans discussed with the patient, family and they are in agreement.  CODE STATUS: Full    TOTAL TIME TAKING CARE OF THIS PATIENT: 45 minutes.    Cammy Copa M.D on 02/07/2018 at 12:34 AM  Between 7am to 6pm - Pager - [phone]  After 6pm go to [email or web address] - password [email or web address] [facility]  [place] [facility]  Office  [phone]  CC: Primary care physician; Dione Housekeeper, MD

## 2018-02-07 NOTE — Discharge Summary (Signed)
[facility] - [place] at [facility]   PATIENT NAME: Zachary CiscoJohn Doyle    MR#:  [id number]  DATE OF BIRTH:  [birth year]  DATE OF ADMISSION:  02/06/2018 ADMITTING PHYSICIAN: Cammy CopaAngela Maier, MD  DATE OF DISCHARGE: No discharge date for patient encounter.  PRIMARY CARE PHYSICIAN: Dione Housekeeperlmedo, Mario Ernesto, MD    ADMISSION DIAGNOSIS:  Intractable vomiting with nausea, unspecified vomiting type [R11.2]  DISCHARGE DIAGNOSIS:  Active Problems:   Acute gastroenteritis   SECONDARY DIAGNOSIS:   Past Medical History:  Diagnosis Date  . Gross hematuria     HOSPITAL COURSE:   1 acute food poisoning versus acute gastroenteritis Resolved Treated with IV fluids for rehydration, antiemetics, and patient did well Tolerating clear liquid diet  2.  Hypokalemia Secondary to nausea/vomiting Repleted  3.  Marijuana abuse Stable   Cessation counseling given while in house   DISCHARGE CONDITIONS:   stable  CONSULTS OBTAINED:    DRUG ALLERGIES:  No Known Allergies  DISCHARGE MEDICATIONS:   Allergies as of 02/07/2018   No Known Allergies     Medication List    TAKE these medications   finasteride 5 MG tablet Commonly known as:  PROSCAR Take 5 mg by mouth daily.   ondansetron 4 MG tablet Commonly known as:  ZOFRAN Take 1 tablet (4 mg total) by mouth every 8 (eight) hours as needed for nausea or vomiting.        DISCHARGE INSTRUCTIONS:  If you experience worsening of your admission symptoms, develop shortness of breath, life threatening emergency, suicidal or homicidal thoughts you must seek medical attention immediately by calling 911 or calling your MD immediately  if symptoms less severe.  You Must read complete instructions/literature along with all the possible adverse reactions/side effects for all the Medicines you take and that have been prescribed to you. Take any new Medicines after you have completely understood and accept all the possible adverse  reactions/side effects.   Please note  You were cared for by a hospitalist during your hospital stay. If you have any questions about your discharge medications or the care you received while you were in the hospital after you are discharged, you can call the unit and asked to speak with the hospitalist on call if the hospitalist that took care of you is not available. Once you are discharged, your primary care physician will handle any further medical issues. Please note that NO REFILLS for any discharge medications will be authorized once you are discharged, as it is imperative that you return to your primary care physician (or establish a relationship with a primary care physician if you do not have one) for your aftercare needs so that they can reassess your need for medications and monitor your lab values.    Today   CHIEF COMPLAINT:   Chief Complaint  Patient presents with  . Emesis  . Diarrhea    HISTORY OF PRESENT ILLNESS:  57 y.o. male with a known history of BPH. Patient presented to emergency room for acute onset of intractable nausea, vomiting and diarrhea going on for the past 24 hours.  He also complains of mid abdominal cramps, worse with vomiting.  Patient had subjective fever and chills at home in the past 24 hours.  Patient denies any travel, no sick contacts.  He ate 1 can of soup that was expired, yesterday, but otherwise nothing new in his diet.  He is been treated with IV antiemetics and IV fluids for the past 6 hours  in the emergency room without much improvement in his symptoms. Blood test done emergency room are remarkable for low potassium level at 3.1.  Urine drug test is positive for marijuana..  Abdominal CAT scan is unremarkable. Patient is admitted for further evaluation and treatment.  VITAL SIGNS:  Blood pressure 109/65, pulse 76, temperature 98.1 F (36.7 C), temperature source Oral, resp. rate 16, height 5\' 11"  (1.803 m), weight 67.6 kg (149 lb), SpO2 100  %.  I/O:    Intake/Output Summary (Last 24 hours) at 02/07/2018 1137 Last data filed at 02/07/2018 0942 Gross per 24 hour  Intake 2338 ml  Output 1100 ml  Net 1238 ml    PHYSICAL EXAMINATION:  GENERAL:  57 y.o.-year-old patient lying in the bed with no acute distress.  EYES: Pupils equal, round, reactive to light and accommodation. No scleral icterus. Extraocular muscles intact.  HEENT: Head atraumatic, normocephalic. Oropharynx and nasopharynx clear.  NECK:  Supple, no jugular venous distention. No thyroid enlargement, no tenderness.  LUNGS: Normal breath sounds bilaterally, no wheezing, rales,rhonchi or crepitation. No use of accessory muscles of respiration.  CARDIOVASCULAR: S1, S2 normal. No murmurs, rubs, or gallops.  ABDOMEN: Soft, non-tender, non-distended. Bowel sounds present. No organomegaly or mass.  EXTREMITIES: No pedal edema, cyanosis, or clubbing.  NEUROLOGIC: Cranial nerves II through XII are intact. Muscle strength 5/5 in all extremities. Sensation intact. Gait not checked.  PSYCHIATRIC: The patient is alert and oriented x 3.  SKIN: No obvious rash, lesion, or ulcer.   DATA REVIEW:   CBC Recent Labs  Lab 02/07/18 0546  WBC 12.9*  HGB 13.3  HCT 38.2*  PLT 235    Chemistries  Recent Labs  Lab 02/06/18 1850 02/07/18 0546  NA 140 142  K 3.1* 3.3*  CL 108 112*  CO2 21* 23  GLUCOSE 104* 93  BUN 9 7  CREATININE 0.83 0.77  CALCIUM 8.4* 8.1*  MG  --  1.9  AST 25  --   ALT 15  --   ALKPHOS 70  --   BILITOT 1.5*  --     Cardiac Enzymes No results for input(s): TROPONINI in the last 168 hours.  Microbiology Results  Results for orders placed or performed during the hospital encounter of 07/19/16  Urine culture     Status: None   Collection Time: 07/19/16 10:37 AM  Result Value Ref Range Status   Specimen Description URINE, CLEAN CATCH  Final   Special Requests NONE  Final   Culture NO GROWTH Performed at [facility]   Final   Report  Status 07/20/2016 FINAL  Final    RADIOLOGY:  Ct Abdomen Pelvis W Contrast  Result Date: 02/07/2018 CLINICAL DATA:  Abdominal pain EXAM: CT ABDOMEN AND PELVIS WITH CONTRAST TECHNIQUE: Multidetector CT imaging of the abdomen and pelvis was performed using the standard protocol following bolus administration of intravenous contrast. CONTRAST:  OMNIPAQUE IOHEXOL 300 MG/ML  SOLN COMPARISON:  08/20/2016 FINDINGS: Lower chest: Emphysematous changes in the lung bases. Hepatobiliary: No focal liver abnormality is seen. No gallstones, gallbladder wall thickening, or biliary dilatation. Pancreas: Unremarkable. No pancreatic ductal dilatation or surrounding inflammatory changes. Spleen: Normal in size without focal abnormality. Adrenals/Urinary Tract: Adrenal glands are unremarkable. Kidneys are normal, without renal calculi, focal lesion, or hydronephrosis. Bladder wall is diffusely thickened. This could be due to cystitis or bladder hypertrophy. Stomach/Bowel: Stomach, small bowel, and colon are not abnormally distended. No wall thickening or inflammatory infiltration identified. Appendix is normal. Vascular/Lymphatic: Aortic  atherosclerosis. No enlarged abdominal or pelvic lymph nodes. Reproductive: Prostate gland is enlarged, measuring 4.6 cm. Other: No free air or free fluid in the abdomen. Abdominal wall musculature appears intact. Musculoskeletal: No acute or significant osseous findings. IMPRESSION: 1. No evidence of bowel obstruction or inflammation. 2. Bladder wall is diffusely thickened. This may be due to cystitis or hypertrophy. 3. Prostate gland is enlarged. Electronically Signed   By: Burman Nieves M.D.   On: 02/07/2018 01:19    EKG:  No orders found for this or any previous visit.    Management plans discussed with the patient, family and they are in agreement.  CODE STATUS:     Code Status Orders  (From admission, onward)        Start     Ordered   02/07/18 0126  Full code   Continuous     02/07/18 0125    Code Status History    This patient has a current code status but no historical code status.      TOTAL TIME TAKING CARE OF THIS PATIENT: 45 minutes.    Evelena Asa Salary M.D on 02/07/2018 at 11:37 AM  Between 7am to 6pm - Pager - [phone]  After 6pm go to [email or web address] - password [email or web address]  [place] [facility]  Office  [phone]  CC: Primary care physician; Dione Housekeeper, MD   Note: This dictation was prepared with Dragon dictation along with smaller phrase technology. Any transcriptional errors that result from this process are unintentional.

## 2018-02-07 NOTE — Progress Notes (Signed)
Pharmacy Electrolyte Monitoring Consult:  Pharmacy consulted to assist in monitoring and replacing electrolytes in this 57 y.o. male admitted on 02/06/2018 with Emesis and Diarrhea   Labs:  Sodium (mmol/L)  Date Value  02/06/2018 140  05/25/2013 134 (L)   Potassium (mmol/L)  Date Value  02/06/2018 3.1 (L)  05/25/2013 3.9   Calcium (mg/dL)  Date Value  16/10/960407/12/2017 8.4 (L)   Calcium, Total (mg/dL)  Date Value  54/09/811910/21/2014 9.0   Albumin (g/dL)  Date Value  14/78/295607/12/2017 4.1  05/25/2013 4.3    Assessment/Plan: Will replace potassium w/ NS + 40 mEq IV fluid infusion at 75 ml/hr. Will recheck w/ am labs.  Thomasene Rippleavid Kadence Mimbs, PharmD, BCPS Clinical Pharmacist 02/07/2018 5:27 AM

## 2018-02-08 LAB — HIV ANTIBODY (ROUTINE TESTING W REFLEX): HIV SCREEN 4TH GENERATION: NONREACTIVE

## 2018-08-20 IMAGING — CT CT RENAL STONE PROTOCOL
2 of 4 series · 17 of 46 positions shown, 19 images · non-contrast
Comparison: 05/25/2013

CLINICAL DATA: Right flank pain for 3 days, hematuria

EXAM:
CT ABDOMEN AND PELVIS WITHOUT CONTRAST
TECHNIQUE: Multidetector CT imaging of the abdomen and pelvis was performed
following the standard protocol without IV contrast.

[Series 2: soft tissue · axial · 0.66mm/px · z∈[-462,-112]mm · 14 of 78 slices shown, 16 images]
[im 4/78  soft-tissue]
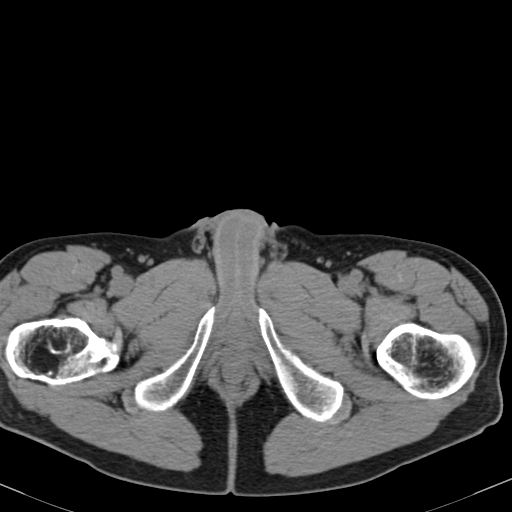
[im 4/78  bone]
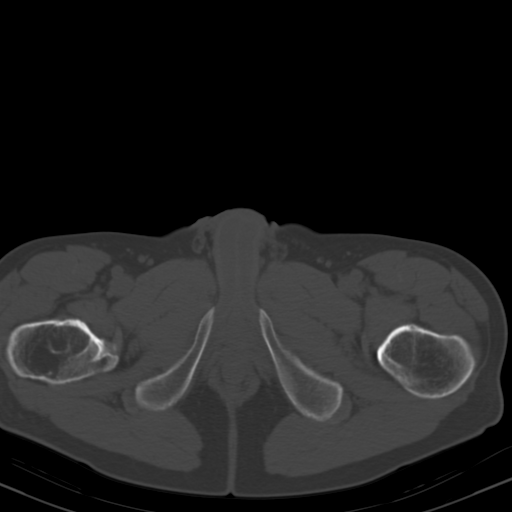
[im 10/78  soft-tissue]
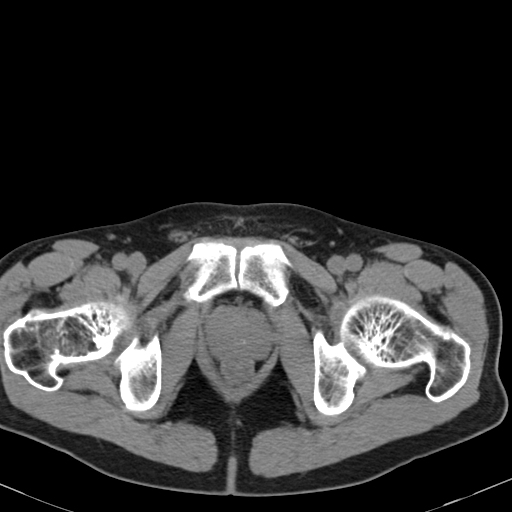
[im 16/78  soft-tissue]
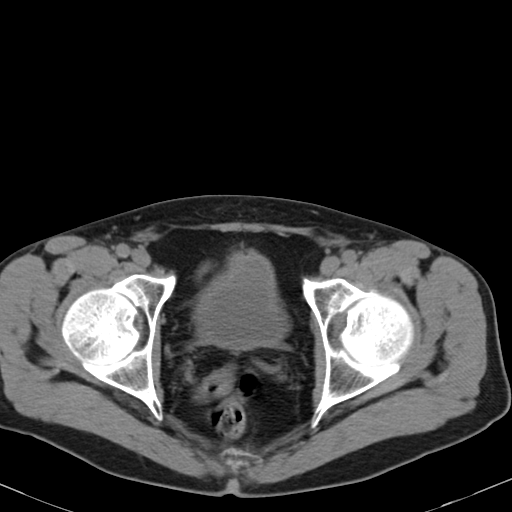
[im 22/78  soft-tissue]
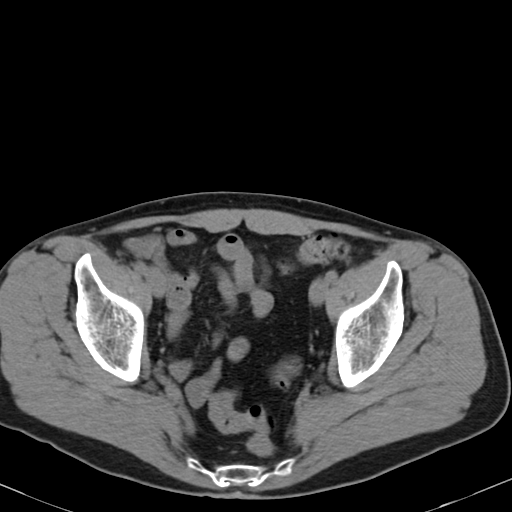
[im 25/78  soft-tissue]
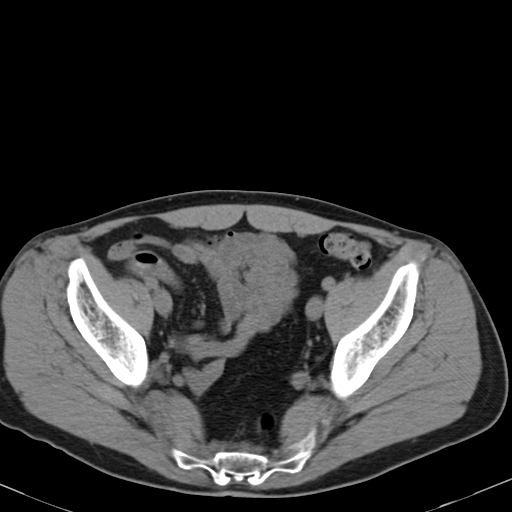
[im 31/78  soft-tissue]
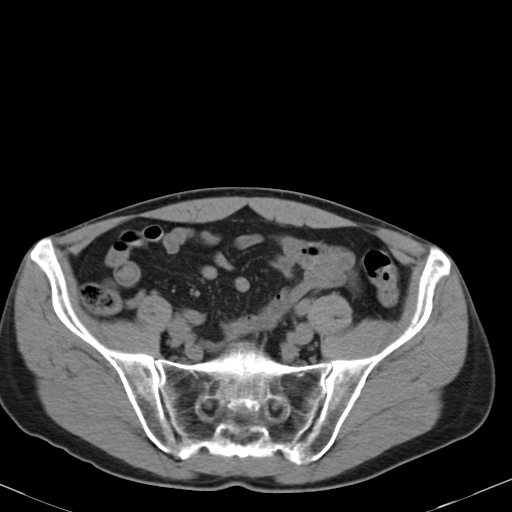
[im 37/78  soft-tissue]
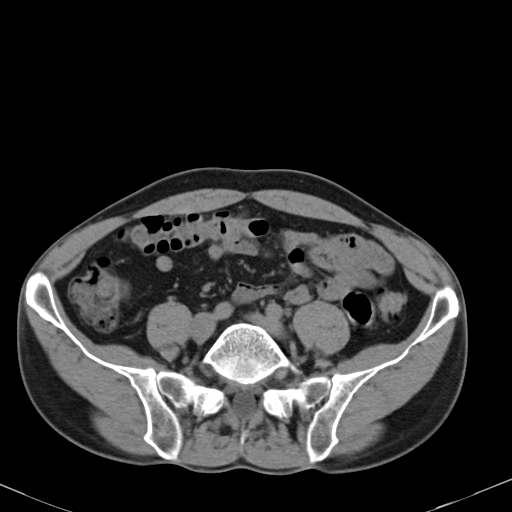
[im 41/78  soft-tissue]
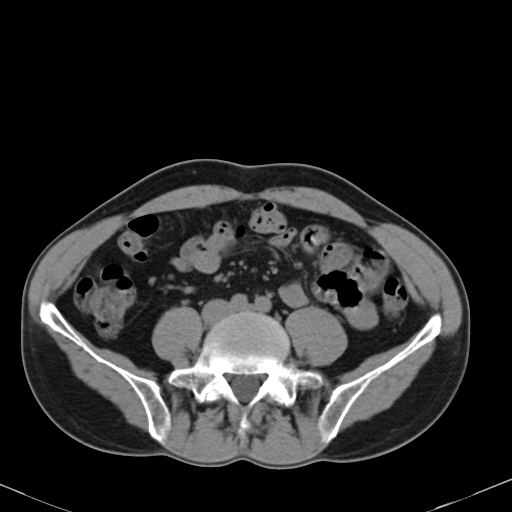
[im 47/78  soft-tissue]
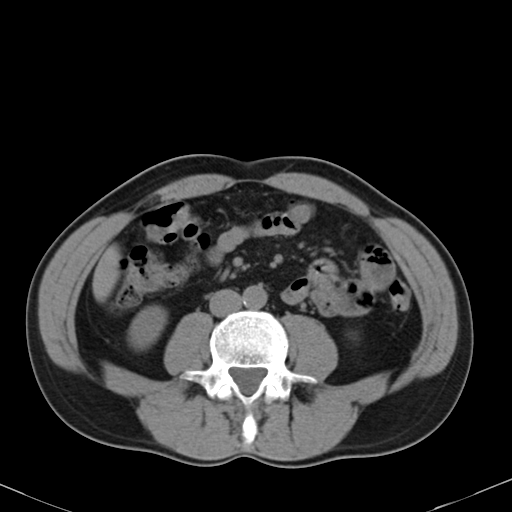
[im 47/78  bone]
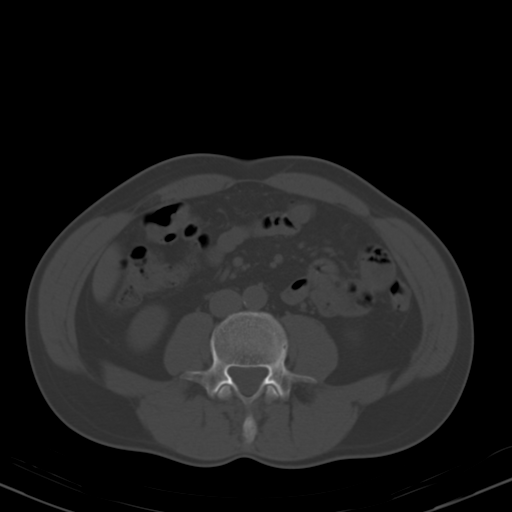
[im 53/78  soft-tissue]
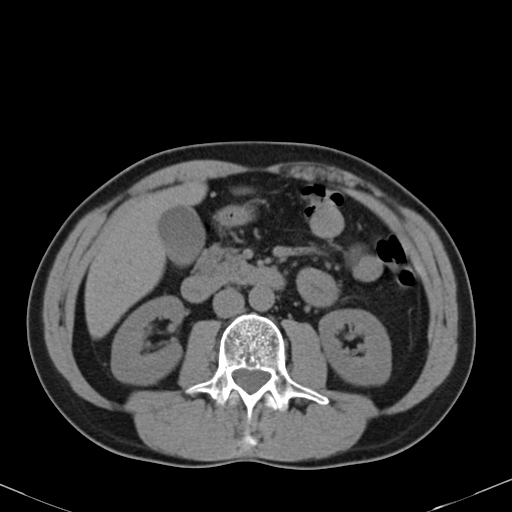
[im 59/78  soft-tissue]
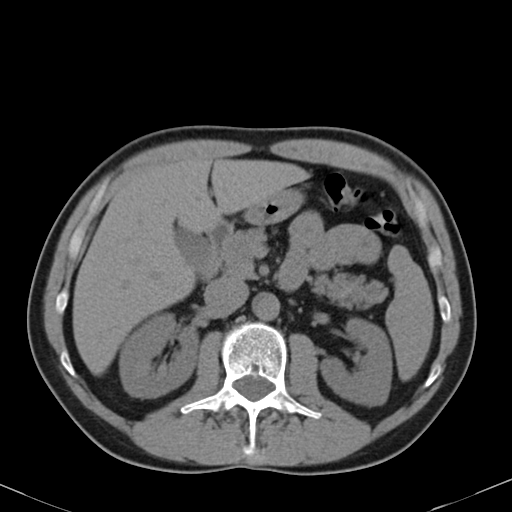
[im 62/78  soft-tissue]
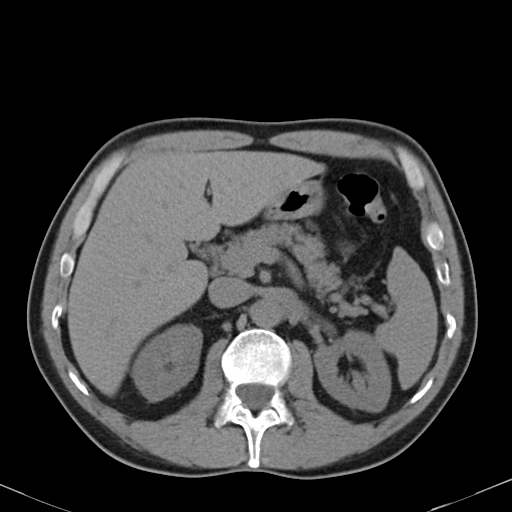
[im 68/78  soft-tissue]
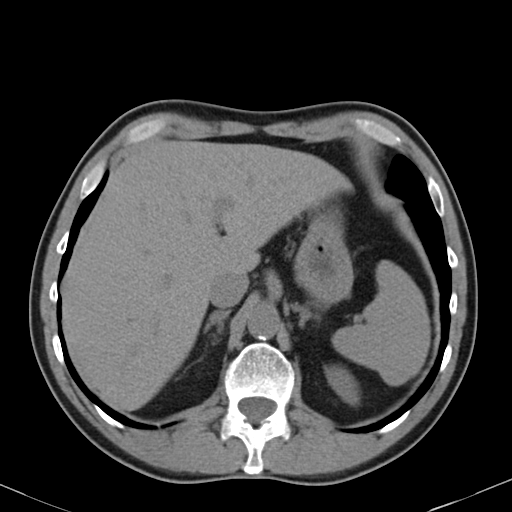
[im 74/78  soft-tissue]
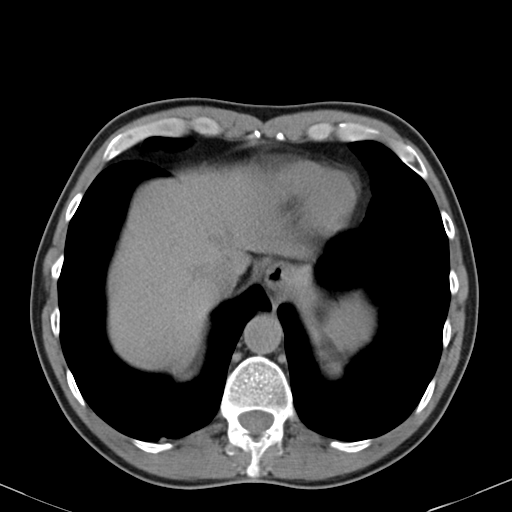

[Series 602: cor · coronal · 0.76mm/px · 3 of 113 slices shown]
[im 38/113  soft-tissue]
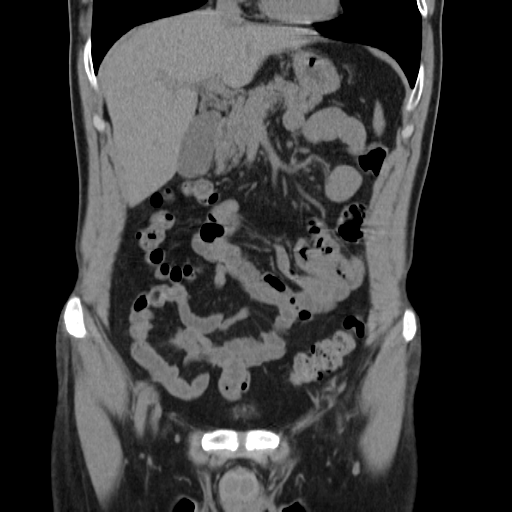
[im 50/113  soft-tissue]
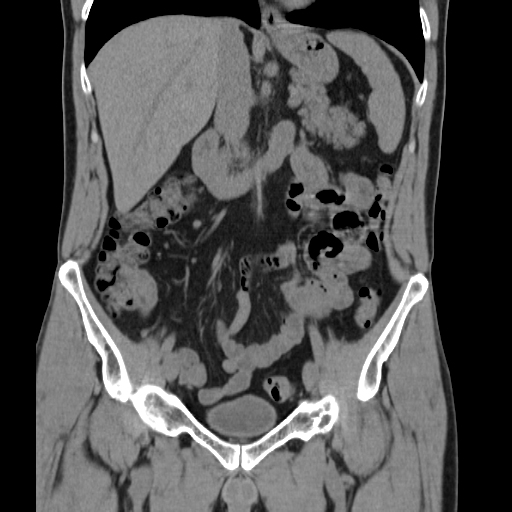
[im 63/113  soft-tissue]
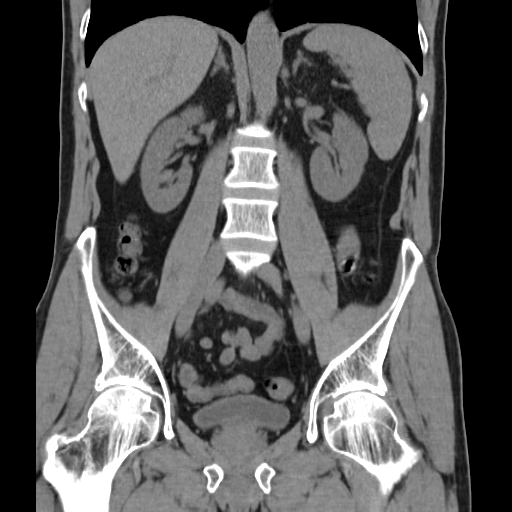

[17 of 46 positions shown; findings below may reference images not displayed]

FINDINGS: Lower chest: Lung bases are clear. No effusions. Heart is normal
size.

Hepatobiliary: No focal hepatic abnormality. Gallbladder
unremarkable.

Pancreas: No focal abnormality or ductal dilatation.

Spleen: No focal abnormality.  Normal size.

Adrenals/Urinary Tract: No adrenal abnormality. No focal renal
abnormality. No stones or hydronephrosis. Urinary bladder is
unremarkable.

Stomach/Bowel: Stomach, large and small bowel grossly unremarkable.

Vascular/Lymphatic: No evidence of aneurysm or adenopathy. Scattered
aortic calcifications.

Reproductive: Mild prostate enlargement with central calcifications.

Other: No free fluid or free air.

Musculoskeletal: No acute bony abnormality or focal bone lesion.
IMPRESSION: No renal or ureteral stones.  No hydronephrosis.

No acute findings in the abdomen or pelvis.

## 2020-03-20 IMAGING — CT CT ABD-PELV W/ CM
2 of 5 series · 16 of 46 positions shown, 18 images · IV contrast (APPLIED)
Comparison: 08/20/2016

CLINICAL DATA: Abdominal pain

EXAM:
CT ABDOMEN AND PELVIS WITH CONTRAST
TECHNIQUE: Multidetector CT imaging of the abdomen and pelvis was performed
using the standard protocol following bolus administration of
intravenous contrast.
CONTRAST:  100mL OMNIPAQUE IOHEXOL 300 MG/ML  SOLN

[Series 2: routine abd/pel with · axial · 0.63mm/px · z∈[-877,-507]mm · 13 of 84 slices shown, 15 images]
[im 5/84  soft-tissue]
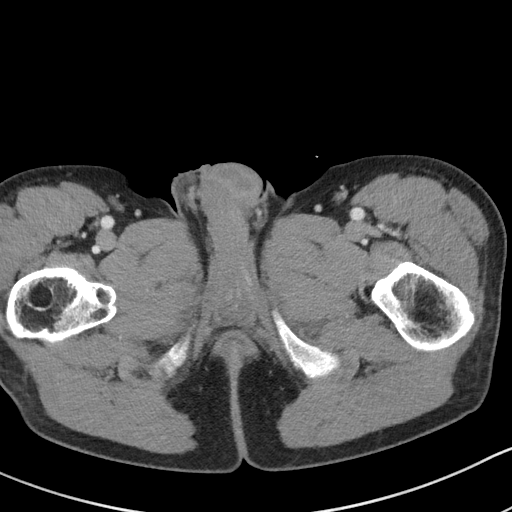
[im 5/84  bone]
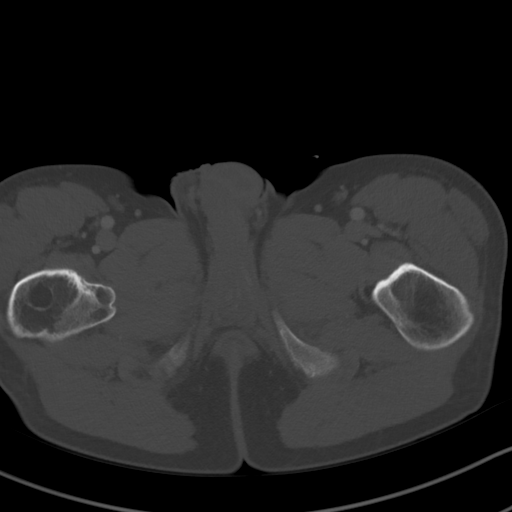
[im 13/84  soft-tissue]
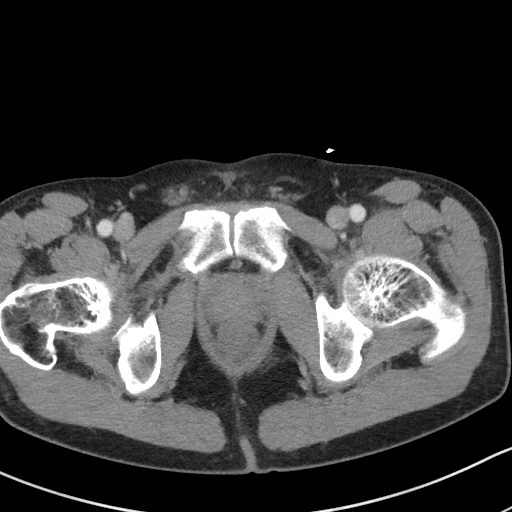
[im 17/84  soft-tissue]
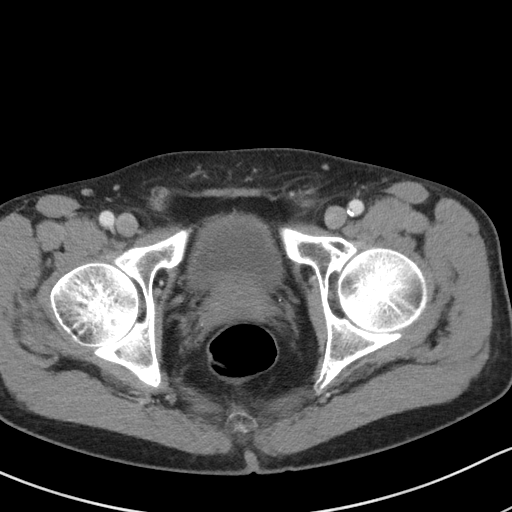
[im 25/84  soft-tissue]
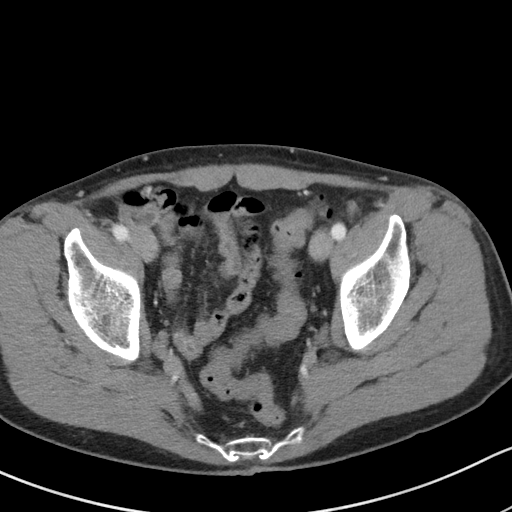
[im 30/84  soft-tissue]
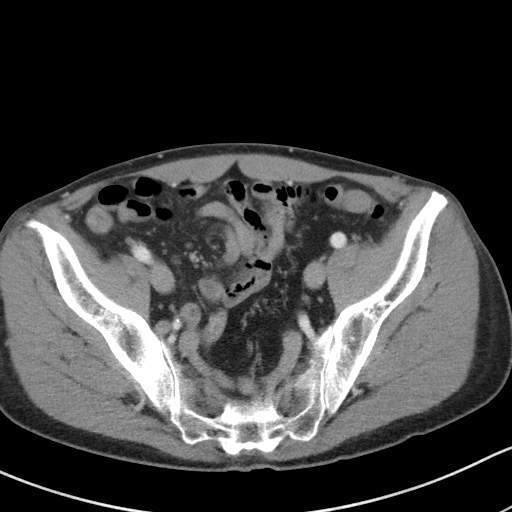
[im 38/84  soft-tissue]
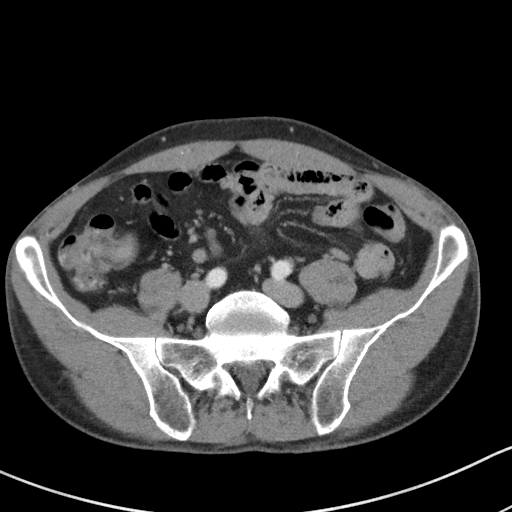
[im 42/84  soft-tissue]
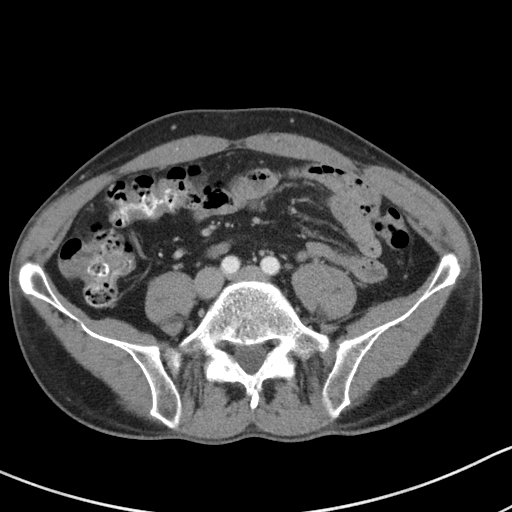
[im 46/84  soft-tissue]
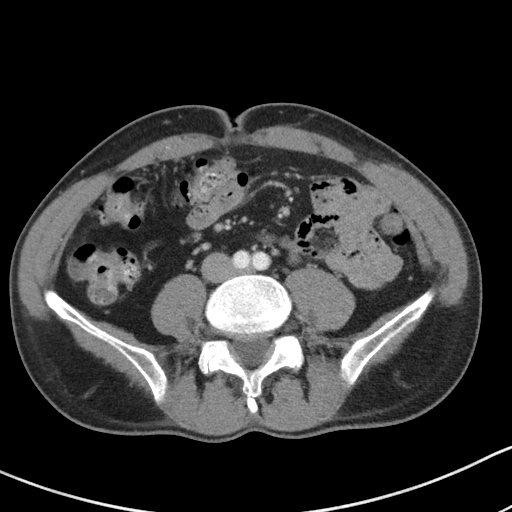
[im 54/84  soft-tissue]
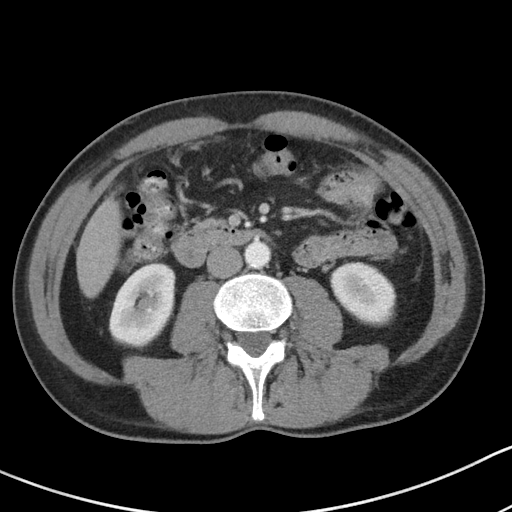
[im 54/84  bone]
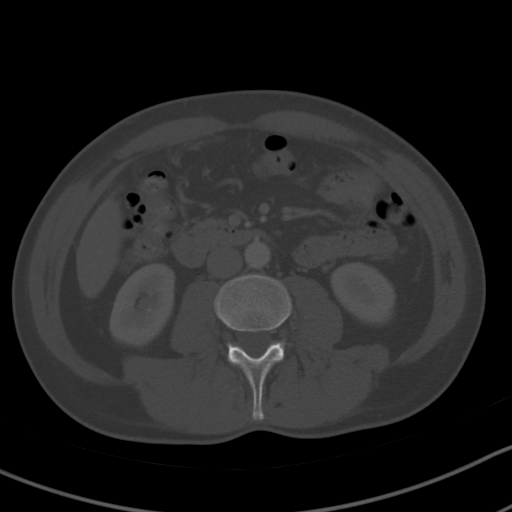
[im 59/84  soft-tissue]
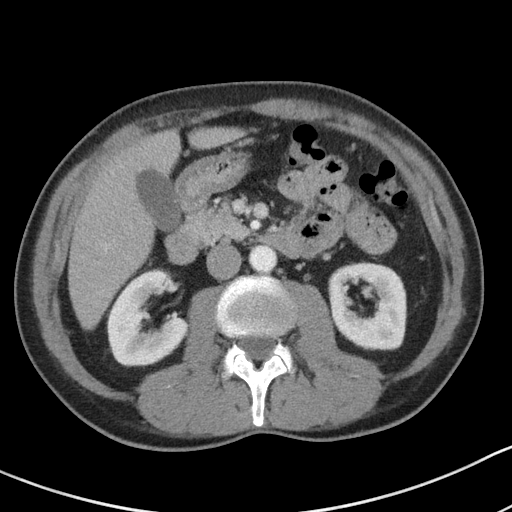
[im 67/84  soft-tissue]
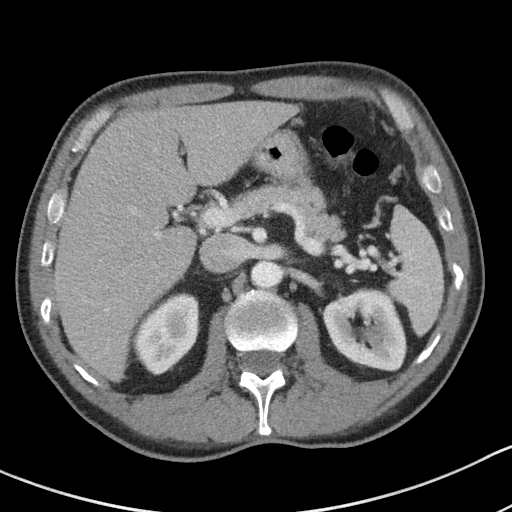
[im 71/84  soft-tissue]
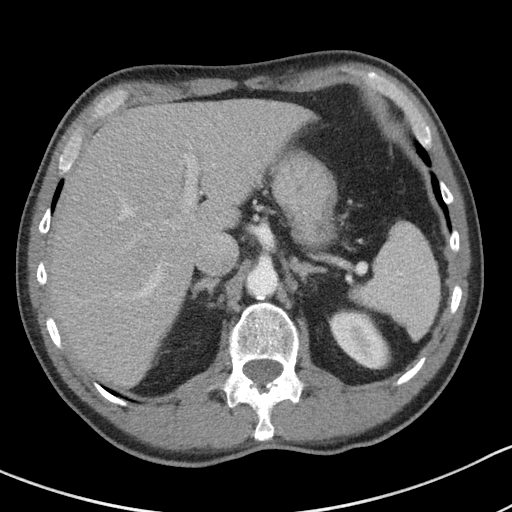
[im 79/84  soft-tissue]
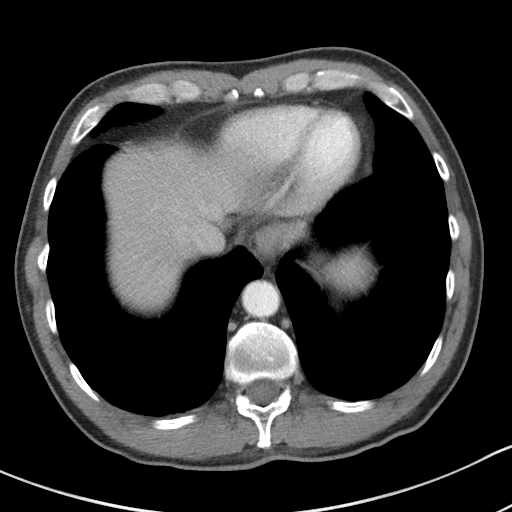

[Series 5: coronal st · coronal · 0.63mm/px · 3 of 84 slices shown]
[im 28/84  soft-tissue]
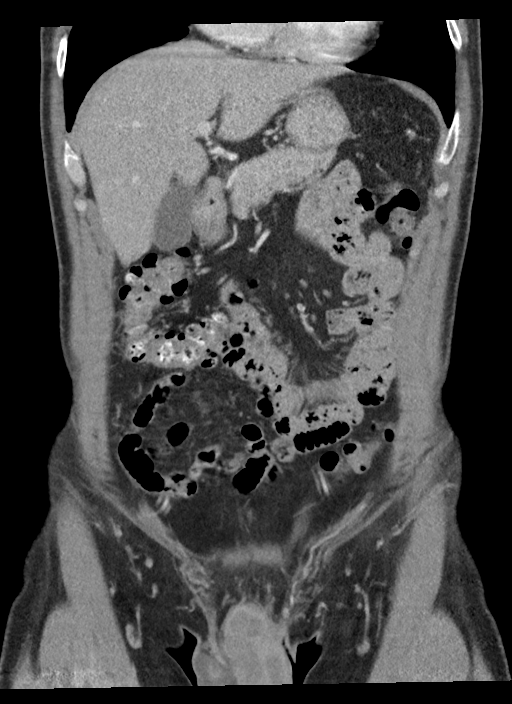
[im 37/84  soft-tissue]
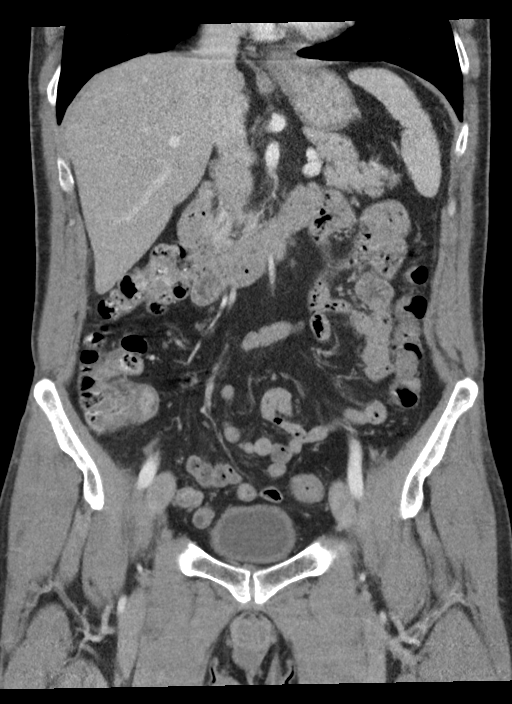
[im 47/84  soft-tissue]
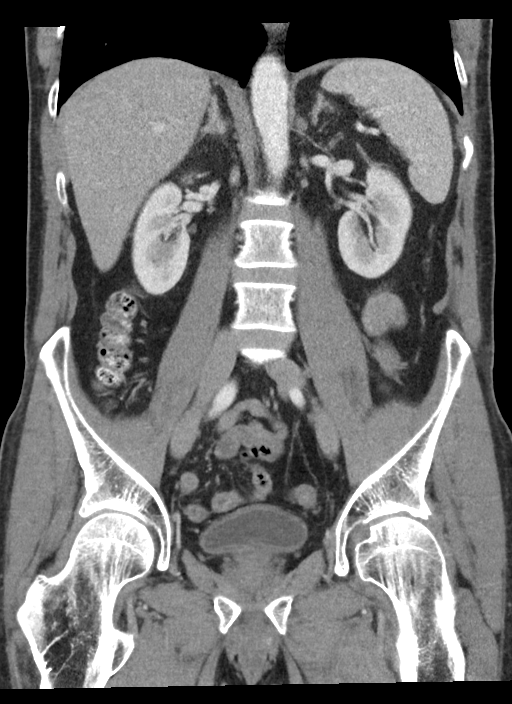

[16 of 46 positions shown; findings below may reference images not displayed]

FINDINGS: Lower chest: Emphysematous changes in the lung bases.

Hepatobiliary: No focal liver abnormality is seen. No gallstones,
gallbladder wall thickening, or biliary dilatation.

Pancreas: Unremarkable. No pancreatic ductal dilatation or
surrounding inflammatory changes.

Spleen: Normal in size without focal abnormality.

Adrenals/Urinary Tract: Adrenal glands are unremarkable. Kidneys are
normal, without renal calculi, focal lesion, or hydronephrosis.
Bladder wall is diffusely thickened. This could be due to cystitis
or bladder hypertrophy.

Stomach/Bowel: Stomach, small bowel, and colon are not abnormally
distended. No wall thickening or inflammatory infiltration
identified. Appendix is normal.

Vascular/Lymphatic: Aortic atherosclerosis. No enlarged abdominal or
pelvic lymph nodes.

Reproductive: Prostate gland is enlarged, measuring 4.6 cm.

Other: No free air or free fluid in the abdomen. Abdominal wall
musculature appears intact.

Musculoskeletal: No acute or significant osseous findings.
IMPRESSION: 1. No evidence of bowel obstruction or inflammation.
2. Bladder wall is diffusely thickened. This may be due to cystitis
or hypertrophy.
3. Prostate gland is enlarged.

## 2021-09-06 ENCOUNTER — Other Ambulatory Visit: Payer: Self-pay

## 2021-09-06 ENCOUNTER — Ambulatory Visit
Admission: EM | Admit: 2021-09-06 | Discharge: 2021-09-06 | Payer: BC Managed Care – PPO | Attending: Emergency Medicine | Admitting: Emergency Medicine

## 2021-09-06 DIAGNOSIS — R001 Bradycardia, unspecified: Secondary | ICD-10-CM

## 2021-09-06 DIAGNOSIS — R55 Syncope and collapse: Secondary | ICD-10-CM | POA: Diagnosis not present

## 2021-09-06 NOTE — Discharge Instructions (Addendum)
As we discussed, there is concern that you are suffering from sick sinus syndrome which affects the body's ability to control its own heart rate and rhythm.  You need to be evaluated in the emergency department and have a cardiology evaluation.  As I have expressed, I think it is safer for you to travel via EMS but you have elected to travel via POV to [facility].  If you suffer another syncopal episode please call 911.

## 2021-09-06 NOTE — ED Notes (Signed)
Patient is being discharged from the Urgent Care and sent to the Emergency Department via POV . Per Thurmond Butts, NP, patient is in need of higher level of care due to syncope episode needing further evaluation. Patient is aware and verbalizes understanding of plan of care.  Vitals:   09/06/21 1920  BP: (!) 148/78  Pulse: (!) 50  Resp: (!) 24  SpO2: 100%

## 2021-09-06 NOTE — ED Notes (Signed)
Pt refused to be transported to [facility] via EMS. Wife states "we are going to [facility]." AMA was obtained from patient's wife.

## 2021-09-06 NOTE — ED Triage Notes (Addendum)
Patient presents to Urgent Care with complaints of possible syncope, sweaty,  dizziness, abdominal pain today. Wife states pt believes he is constipated and was in the restroom where he believes he had syncope episode. He found himself lying on the bathroom floor. He began vomiting about 30 mins ago. Wife states she brought him in concerned with SOB since his lips were bluish/grayish in color.   Denies chest pain or SOB.

## 2021-09-06 NOTE — ED Provider Notes (Addendum)
[facility]    CSN: [id number] Arrival date & time: 09/06/21  1912      History   Chief Complaint Chief Complaint  Patient presents with   Abdominal Pain   Dizziness    HPI Zachary Doyle is a 61 y.o. male.   HPI  61 year old male here for evaluation of syncope and abdominal pain.  Patient was brought in by his wife for evaluation of a syncopal episode that happened 30 minutes prior to arrival.  She reports that her husband was cooking dinner, felt dizzy and went to the bathroom because he felt like he had to have a bowel movement and had a syncopal episode while he was on the toilet.  She states that she found him laying on the floor the bathroom sweating profusely.  He also had episode of nausea and vomiting.  She reports it took approximately 30 minutes before he was able to gather enough strength to be able to get into the car and come in for evaluation.  He denies any chest pain or shortness of breath.  He is complaining of pain across the middle of his abdomen.  He states that he used the bathroom earlier and he did have a small hard bowel movement but denies any blood.  Per his wife he had a similar episode of abdominal pain and sweating in October that took him several days to recover from but he was never evaluated.  Wife concerned because he was gray with the lips.  Patient is mentating well and able to manage his tell me his name and relay the history of what happened.  He does have mild tachypnea with respirate of 24.  Past Medical History:  Diagnosis Date   Gross hematuria     Patient Active Problem List   Diagnosis Date Noted   Acute gastroenteritis 02/07/2018   Laboratory procedures 04/15/2014    Past Surgical History:  Procedure Laterality Date   HIP FRACTURE SURGERY Right    NASAL SINUS SURGERY  2008   TONSILLECTOMY         Home Medications    Prior to Admission medications   Medication Sig Start Date End Date Taking? Authorizing Provider   finasteride (PROSCAR) 5 MG tablet Take 5 mg by mouth daily. 01/29/18   [provider]    Family History Family History  Problem Relation Age of Onset   Cancer Sister        appendix   Prostate cancer Neg Hx    Kidney cancer Neg Hx    Bladder Cancer Neg Hx     Social History Social History   Tobacco Use   Smoking status: Never   Smokeless tobacco: Never  Substance Use Topics   Alcohol use: Yes    Comment: rare   Drug use: No     Allergies   Patient has no known allergies.   Review of Systems Review of Systems  Constitutional:  Positive for diaphoresis.  Respiratory:  Negative for shortness of breath.   Cardiovascular:  Negative for chest pain.  Gastrointestinal:  Positive for constipation, nausea and vomiting.  Skin:  Negative for rash.  Neurological:  Positive for syncope.  Hematological: Negative.   Psychiatric/Behavioral: Negative.      Physical Exam Triage Vital Signs ED Triage Vitals [09/06/21 1920]  Enc Vitals Group     BP (!) 148/78     Pulse Rate (!) 50     Resp (!) 24     Temp  Temp Source Oral     SpO2 100 %     Weight      Height      Head Circumference      Peak Flow      Pain Score      Pain Loc      Pain Edu?      Excl. in GC?    No data found.  Updated Vital Signs BP (!) 148/78 (BP Location: Left Arm)    Pulse (!) 50    Resp (!) 24    SpO2 100%   Visual Acuity Right Eye Distance:   Left Eye Distance:   Bilateral Distance:    Right Eye Near:   Left Eye Near:    Bilateral Near:     Physical Exam Vitals and nursing note reviewed.  Constitutional:      General: He is not in acute distress.    Appearance: Normal appearance. He is not ill-appearing.  HENT:     Head: Normocephalic and atraumatic.  Cardiovascular:     Rate and Rhythm: Regular rhythm. Bradycardia present.     Pulses: Normal pulses.     Heart sounds: Normal heart sounds. No murmur heard.   No friction rub. No gallop.  Pulmonary:     Effort:  Pulmonary effort is normal.     Breath sounds: Normal breath sounds. No wheezing, rhonchi or rales.  Abdominal:     General: Abdomen is flat. Bowel sounds are normal.     Palpations: Abdomen is soft.     Tenderness: There is no abdominal tenderness. There is no guarding or rebound.  Skin:    General: Skin is warm.     Capillary Refill: Capillary refill takes less than 2 seconds.     Coloration: Skin is pale.  Neurological:     General: No focal deficit present.     Mental Status: He is alert and oriented to person, place, and time.  Psychiatric:        Mood and Affect: Mood normal.        Behavior: Behavior normal.        Thought Content: Thought content normal.        Judgment: Judgment normal.     UC Treatments / Results  Labs (all labs ordered are listed, but only abnormal results are displayed) Labs Reviewed - No data to display  EKG Marked sinus bradycardia with ventricular rate of 47 bpm Parable 174 ms QRS duration 92 ms QT/QTc 426/377 ms No ST or T wave abnormalities.   Radiology No results found.  Procedures Procedures (including critical care time)  Medications Ordered in UC Medications - No data to display  Initial Impression / Assessment and Plan / UC Course  I have reviewed the triage vital signs and the nursing notes.  Pertinent labs & imaging results that were available during my care of the patient were reviewed by me and considered in my medical decision making (see chart for details).  Patient presents for evaluation of syncope and abdominal pain that started approximately 30 minutes prior to arrival.  He is mentating and able to tell me what happened.  He is clear pain across his middle of his abdomen but denies any chest pain or shortness of breath.  He is mildly tachypneic with a respiratory to 24 and has been expressing some sighing respirations.  Patient's heart rate is 47-50 and EKG was obtained.  Patient reports that his typical resting heart  rate is approximately  62 bpm.  EKG shows marked sinus bradycardia but no ST or T wave abnormalities.  I do not see any P waves in lead V2 and possibly V1 but there is a P wave in the remainder of his leads.  EKG was compared to previous EKG from 11/08/2011 and there is no change in morphology.  Patient insisted on ambulating to the bathroom to have a bowel movement.  Afterwards he says that his abdominal pain is better and he feels better.  Patient continues to have a low heart rate.  When talking with the patient his heart rate increased to 115 bpm.  This correlated with palpation of his left radial pulse.  His heart rate then decreased back into the 40s and has been varying wildly since.  Concerned patient has sick sinus syndrome.  I have advised patient that he needs to be evaluated in the emergency department and I recommended he travel to the hospital by EMS.  Wife would like him evaluated at [facility] and she would like to take him by POV.  I explained to her that I do not think it is safe for him to travel via POV and that if she chooses to take him to [facility], which is her right, the patient would have to sign out AMA.  The patient's spouse is calling a cardiologist that she knows to ask their advice.  Patient and spouse state that they want to travel to [facility] via [place].   Final Clinical Impressions(s) / UC Diagnoses   Final diagnoses:  Syncope, unspecified syncope type  Bradycardia     Discharge Instructions      As we discussed, there is concern that you are suffering from sick sinus syndrome which affects the body's ability to control its own heart rate and rhythm.  You need to be evaluated in the emergency department and have a cardiology evaluation.  As I have expressed, I think it is safer for you to travel via EMS but you have elected to travel via POV to [facility].  If you suffer another syncopal episode please call 911.     ED Prescriptions   None    PDMP not reviewed this  encounter.   Margarette Canada, NP 09/06/21 1954    Margarette Canada, NP 09/06/21 2003

## 2021-09-06 NOTE — ED Notes (Signed)
Provider at bedside

## 2024-07-15 ENCOUNTER — Ambulatory Visit

## 2024-07-15 ENCOUNTER — Ambulatory Visit
Admission: EM | Admit: 2024-07-15 | Discharge: 2024-07-15 | Disposition: A | Discharge status: Home / Self Care | Attending: Emergency Medicine | Admitting: Emergency Medicine

## 2024-07-15 DIAGNOSIS — R052 Subacute cough: Secondary | ICD-10-CM

## 2024-07-15 DIAGNOSIS — J189 Pneumonia, unspecified organism: Secondary | ICD-10-CM | POA: Diagnosis not present

## 2024-07-15 MED ORDER — ALBUTEROL SULFATE HFA 108 (90 BASE) MCG/ACT IN AERS: 1.0000 | INHALATION_SPRAY | RESPIRATORY_TRACT | 0 refills | Status: AC | PRN

## 2024-07-15 MED ORDER — AEROCHAMBER MV MISC: 1 refills | Status: AC

## 2024-07-15 MED ORDER — PROMETHAZINE-DM 6.25-15 MG/5ML PO SYRP: 5.0000 mL | ORAL_SOLUTION | Freq: Four times a day (QID) | ORAL | 0 refills | Status: AC | PRN

## 2024-07-15 MED ORDER — IBUPROFEN 600 MG PO TABS: 600.0000 mg | ORAL_TABLET | Freq: Four times a day (QID) | ORAL | 0 refills | Status: AC | PRN

## 2024-07-15 MED ORDER — PREDNISONE 20 MG PO TABS: 40.0000 mg | ORAL_TABLET | Freq: Every day | ORAL | 0 refills | Status: AC

## 2024-07-15 NOTE — ED Provider Notes (Signed)
 HPI  SUBJECTIVE:  Zachary Doyle is a 63 y.o. male who presents with cough for a month that has now become productive of thick yellowish-white sputum.  He reports constant nonradiating, nonmigratory right sided upper chest pain starting today described as a pulled muscle that has mostly resolved.  No fevers at home, but he has a low-grade fever here at 100.3.  No nasal congestion, sinus pain or pressure, postnasal drip, wheezing or shortness of breath, dyspnea on exertion.  He is unable to sleep at night because of the cough.  He took 2 days of amoxicillin for dental infection within the past 3 months but did not finish the prescription.  No antipyretic in the past 6 hours.  He has tried NyQuil and a leftover prescription cough medication without improvement in his symptoms.  His chest pain is worse with exposure to cold air.  There is no exertional or pleuritic component.  No surgery in the past 12 weeks, recent immobilization, calf pain or swelling, hemoptysis.  No history of diabetes, hypertension, chronic kidney disease, pulmonary disease, smoking, PE/DVT, cancer, coronary disease, MI.  PCP: [facility].  States he has a physical with them in 1 month.    Past Medical History:  Diagnosis Date   Gross hematuria     Past Surgical History:  Procedure Laterality Date   HIP FRACTURE SURGERY Right    NASAL SINUS SURGERY  2008   TONSILLECTOMY      Family History  Problem Relation Age of Onset   Cancer Sister        appendix   Prostate cancer Neg Hx    Kidney cancer Neg Hx    Bladder Cancer Neg Hx     Social History[1]  Current Medications[2]  Allergies[3]   ROS  As noted in HPI.   Physical Exam  BP (!) 144/87 (BP Location: Left Arm)   Pulse (!) 105   Temp 100.3 F (37.9 C) (Oral)   Wt 65.8 kg   SpO2 95%   BMI 20.22 kg/m   Constitutional: Well developed, well nourished, no acute distress Eyes: PERRL, EOMI, conjunctiva normal bilaterally HENT: Normocephalic,  atraumatic,mucus membranes moist Respiratory: Clear to auscultation bilaterally, no rales, no wheezing, no rhonchi.  No anterior, lateral chest wall tenderness Cardiovascular: Regular tachycardia, no murmurs, no gallops, no rubs GI: nondistended skin: No rash, skin intact Musculoskeletal: Calves symmetric, nontender, no edema Neurologic: Alert & oriented x 3, CN III-XII grossly intact, no motor deficits, sensation grossly intact Psychiatric: Speech and behavior appropriate   ED Course   Medications - No data to display  Orders Placed This Encounter  Procedures   DG Chest 2 View    Standing Status:   Standing    Number of Occurrences:   1    Reason for Exam (SYMPTOM  OR DIAGNOSIS REQUIRED):   Cough for 1 month rule out acute cardiopulmonary disease   No results found for this or any previous visit (from the past 24 hours). DG Chest 2 View Result Date: 07/15/2024 EXAM: 2 VIEW(S) XRAY OF THE CHEST 07/15/2024 07:23:02 PM COMPARISON: None available. CLINICAL HISTORY: Cough for 1 month rule out acute cardiopulmonary disease. FINDINGS: LUNGS AND PLEURA: Airspace disease in the anterior right upper lobe, somewhat rounded in appearance on the frontal view. This likely reflects right upper lobe pneumonia. No pleural effusion. No pneumothorax. HEART AND MEDIASTINUM: Aortic atherosclerosis. No acute abnormality of the cardiac and mediastinal silhouettes. BONES AND SOFT TISSUES: No acute osseous abnormality. IMPRESSION: 1.  Likely right upper lobe pneumonia. Recommend follow-up after treatment to ensure complete resolution . 2. Aortic atherosclerosis. Electronically signed by: Franky Crease MD 07/15/2024 07:35 PM EST RP Workstation: [id number]    ED Clinical Impression  1. Pneumonia of right upper lobe due to infectious organism   2. Subacute cough      ED Assessment/Plan     Patient presents with an acute illness with systemic symptoms of fever and tachycardia.  [place] Narcotic database  reviewed for this patient, and feel that the risk/benefit ratio today is favorable for proceeding with a prescription for controlled substance.  Reviewed imaging independently.  Right upper lobe pneumonia.  Radiology recommends follow-up x-ray in 4 to 6 weeks after treatment to ensure complete resolution.  See radiology report for full details.  Sending home with Augmentin 875 mg p.o. twice daily and azithromycin Z-Pak for 5 days, an albuterol inhaler with spacer 2 puffs every 4-6 hours, Mucinex, Promethazine  DM, prednisone 40 mg for 5 days, Tylenol  combined with ibuprofen 3-4 times a day as needed.  States he has follow-up with his PCP for routine physical in 1 month and will get a repeat x-ray at that time.  Discussed imaging, MDM, treatment plan, and plan for follow-up with patient and spouse.  Discussed sn/sx that should prompt return to the ED. they agree with plan.   Meds ordered this encounter  Medications   azithromycin (ZITHROMAX) 250 MG tablet    Sig: Take 1 tablet (250 mg total) by mouth daily. 2 tabs po on day 1, 1 tab po on days 2-5    Dispense:  6 tablet    Refill:  0   amoxicillin-clavulanate (AUGMENTIN) 875-125 MG tablet    Sig: Take 1 tablet by mouth every 12 (twelve) hours for 5 days.    Dispense:  10 tablet    Refill:  0   albuterol (VENTOLIN HFA) 108 (90 Base) MCG/ACT inhaler    Sig: Inhale 1-2 puffs into the lungs every 4 (four) hours as needed for wheezing or shortness of breath.    Dispense:  1 each    Refill:  0   predniSONE (DELTASONE) 20 MG tablet    Sig: Take 2 tablets (40 mg total) by mouth daily with breakfast for 5 days.    Dispense:  10 tablet    Refill:  0   ibuprofen (ADVIL) 600 MG tablet    Sig: Take 1 tablet (600 mg total) by mouth every 6 (six) hours as needed.    Dispense:  30 tablet    Refill:  0   promethazine -dextromethorphan (PROMETHAZINE -DM) 6.25-15 MG/5ML syrup    Sig: Take 5 mLs by mouth 4 (four) times daily as needed for cough.    Dispense:   118 mL    Refill:  0   Spacer/Aero-Holding Chambers (AEROCHAMBER MV) inhaler    Sig: Use as instructed    Dispense:  1 each    Refill:  1      *This clinic note was created using Dragon dictation software. Therefore, there may be occasional mistakes despite careful proofreading. ?      [1]  Social History Tobacco Use   Smoking status: Never   Smokeless tobacco: Never  Vaping Use   Vaping status: Never Used  Substance Use Topics   Alcohol use: Yes    Comment: rare   Drug use: No  [2] No current facility-administered medications for this encounter.  Current Outpatient Medications:    albuterol (VENTOLIN HFA) 108 (90 Base)  MCG/ACT inhaler, Inhale 1-2 puffs into the lungs every 4 (four) hours as needed for wheezing or shortness of breath., Disp: 1 each, Rfl: 0   amoxicillin-clavulanate (AUGMENTIN) 875-125 MG tablet, Take 1 tablet by mouth every 12 (twelve) hours for 5 days., Disp: 10 tablet, Rfl: 0   azithromycin (ZITHROMAX) 250 MG tablet, Take 1 tablet (250 mg total) by mouth daily. 2 tabs po on day 1, 1 tab po on days 2-5, Disp: 6 tablet, Rfl: 0   finasteride  (PROSCAR ) 5 MG tablet, Take 5 mg by mouth daily., Disp: , Rfl:    ibuprofen (ADVIL) 600 MG tablet, Take 1 tablet (600 mg total) by mouth every 6 (six) hours as needed., Disp: 30 tablet, Rfl: 0   predniSONE (DELTASONE) 20 MG tablet, Take 2 tablets (40 mg total) by mouth daily with breakfast for 5 days., Disp: 10 tablet, Rfl: 0   promethazine -dextromethorphan (PROMETHAZINE -DM) 6.25-15 MG/5ML syrup, Take 5 mLs by mouth 4 (four) times daily as needed for cough., Disp: 118 mL, Rfl: 0   Spacer/Aero-Holding Chambers (AEROCHAMBER MV) inhaler, Use as instructed, Disp: 1 each, Rfl: 1 [3] No Known Allergies    Van Knee, MD 07/16/24 1351

## 2024-07-15 NOTE — Discharge Instructions (Addendum)
 You have a right upper lobe pneumonia.  Finish the antibiotics even if you feel better.  2 puffs from your albuterol inhaler using your spacer every 4-6 hours as needed for coughing, wheezing, shortness of breath.  This will help open up your lungs.  Plain Mucinex will loosen the phlegm.  Prednisone 40 mg for 5 days for inflammation in the lungs.  600 mg of ibuprofen by 1000 g of Tylenol  together 3-4 times a day for fever, pain.  Promethazine  DM for cough.  Go to the ER if you are not better in 72 hours, sooner if you get worse.  You will need a repeat x-ray in 4-6 weeks to make sure that this has completely resolved.  You can have this done at your yearly physical.

## 2024-07-15 NOTE — ED Triage Notes (Signed)
 Pt is with his wife  Pt c/o cough x27month  Pt is concerned about bronchitis

## 2024-08-13 ENCOUNTER — Other Ambulatory Visit: Payer: Self-pay | Admitting: Family Medicine

## 2024-08-13 ENCOUNTER — Ambulatory Visit
Admission: RE | Admit: 2024-08-13 | Discharge: 2024-08-13 | Disposition: A | Attending: Family Medicine | Admitting: Family Medicine

## 2024-08-13 ENCOUNTER — Ambulatory Visit
Admission: RE | Admit: 2024-08-13 | Discharge: 2024-08-13 | Disposition: A | Source: Ambulatory Visit | Attending: Family Medicine | Admitting: Family Medicine

## 2024-08-13 DIAGNOSIS — R059 Cough, unspecified: Secondary | ICD-10-CM
